# Patient Record
Sex: Male | Born: 1960 | Race: White | Hispanic: No | Marital: Married | Smoking: Never smoker
Health system: Southern US, Community
[De-identification: ages and names within clinical notes are randomized; demographics above are authoritative.]

## PROBLEM LIST (undated history)

## (undated) DIAGNOSIS — K219 Gastro-esophageal reflux disease without esophagitis: Secondary | ICD-10-CM

## (undated) DIAGNOSIS — G4730 Sleep apnea, unspecified: Secondary | ICD-10-CM

## (undated) DIAGNOSIS — D649 Anemia, unspecified: Secondary | ICD-10-CM

## (undated) DIAGNOSIS — I10 Essential (primary) hypertension: Secondary | ICD-10-CM

## (undated) DIAGNOSIS — R001 Bradycardia, unspecified: Secondary | ICD-10-CM

## (undated) HISTORY — DX: Essential (primary) hypertension: I10

## (undated) HISTORY — DX: Gastro-esophageal reflux disease without esophagitis: K21.9

## (undated) HISTORY — DX: Bradycardia, unspecified: R00.1

## (undated) HISTORY — PX: WISDOM TOOTH EXTRACTION: SHX21

## (undated) HISTORY — DX: Sleep apnea, unspecified: G47.30

## (undated) HISTORY — PX: HERNIA REPAIR: SHX51

## (undated) HISTORY — PX: INGUINAL HERNIA REPAIR: SUR1180

## (undated) HISTORY — DX: Anemia, unspecified: D64.9

---

## 2014-06-02 ENCOUNTER — Encounter: Payer: Self-pay | Admitting: Family Medicine

## 2014-06-02 ENCOUNTER — Ambulatory Visit (INDEPENDENT_AMBULATORY_CARE_PROVIDER_SITE_OTHER): Payer: BLUE CROSS/BLUE SHIELD | Admitting: Family Medicine

## 2014-06-02 ENCOUNTER — Telehealth: Payer: Self-pay | Admitting: *Deleted

## 2014-06-02 VITALS — BP 132/90 | HR 60 | Temp 98.3°F | Resp 16 | Ht 70.25 in | Wt 208.0 lb

## 2014-06-02 DIAGNOSIS — Z125 Encounter for screening for malignant neoplasm of prostate: Secondary | ICD-10-CM

## 2014-06-02 DIAGNOSIS — Z7689 Persons encountering health services in other specified circumstances: Secondary | ICD-10-CM

## 2014-06-02 DIAGNOSIS — Z130 Encounter for screening for diseases of the blood and blood-forming organs and certain disorders involving the immune mechanism: Secondary | ICD-10-CM | POA: Diagnosis not present

## 2014-06-02 DIAGNOSIS — Z13220 Encounter for screening for lipoid disorders: Secondary | ICD-10-CM

## 2014-06-02 DIAGNOSIS — Z7189 Other specified counseling: Secondary | ICD-10-CM | POA: Diagnosis not present

## 2014-06-02 DIAGNOSIS — Z131 Encounter for screening for diabetes mellitus: Secondary | ICD-10-CM

## 2014-06-02 LAB — LIPID PANEL
CHOLESTEROL: 148 mg/dL (ref 0–200)
HDL: 52 mg/dL (ref >=40)
LDL Cholesterol: 80 mg/dL (ref 0–99)
TRIGLYCERIDES: 78 mg/dL (ref <150)
Total CHOL/HDL Ratio: 2.8 Ratio
VLDL: 16 mg/dL (ref 0–40)

## 2014-06-02 LAB — COMPREHENSIVE METABOLIC PANEL
ALT: 21 U/L (ref 0–53)
AST: 16 U/L (ref 0–37)
Albumin: 4.7 g/dL (ref 3.5–5.2)
Alkaline Phosphatase: 56 U/L (ref 39–117)
BUN: 14 mg/dL (ref 6–23)
CALCIUM: 9.2 mg/dL (ref 8.4–10.5)
CHLORIDE: 103 meq/L (ref 96–112)
CO2: 28 meq/L (ref 19–32)
Creat: 0.84 mg/dL (ref 0.50–1.35)
Glucose, Bld: 100 mg/dL — ABNORMAL HIGH (ref 70–99)
Potassium: 4.1 mEq/L (ref 3.5–5.3)
Sodium: 139 mEq/L (ref 135–145)
Total Bilirubin: 0.5 mg/dL (ref 0.2–1.2)
Total Protein: 7 g/dL (ref 6.0–8.3)

## 2014-06-02 LAB — CBC
HEMATOCRIT: 42.4 % (ref 39.0–52.0)
Hemoglobin: 14.2 g/dL (ref 13.0–17.0)
MCH: 29.6 pg (ref 26.0–34.0)
MCHC: 33.5 g/dL (ref 30.0–36.0)
MCV: 88.5 fL (ref 78.0–100.0)
MPV: 10.8 fL (ref 8.6–12.4)
PLATELETS: 240 10*3/uL (ref 150–400)
RBC: 4.79 MIL/uL (ref 4.22–5.81)
RDW: 13.2 % (ref 11.5–15.5)
WBC: 6.2 10*3/uL (ref 4.0–10.5)

## 2014-06-02 NOTE — Patient Instructions (Signed)
Great to meet you today- I will be in touch with your labs via mail unless you decide to sign up for mychart.   We will work on getting your old records so I can update your immunizations, etc

## 2014-06-02 NOTE — Telephone Encounter (Signed)
Faxed signed authorization for medical record release to attention Vaughan Basta (MR). Patient's PCP in [facility] was Dr Nicoletta Dress. Confirmation page received at 2:33 pm

## 2014-06-02 NOTE — Progress Notes (Signed)
[facility] [address], [place] [address] [phone]  Date:  06/02/2014   Name:  Patrick Willis   DOB:  08/29/1960   MRN:  [id number]  PCP:  No primary care provider on file.    Chief Complaint: Establish Care   History of Present Illness:  Patrick Willis is a 54 y.o. very pleasant male patient who presents with the following:  Here today to establish care with me today. He and his wife and 2 children recently moved to [place] from [place], [place].  He is a generally health person.   His BP is generally ok. He given blood every 6 weeks and is always ok per [facility] He thinks that he had a tetanus shot in the last few years.  Declines a flu shot today He had a colonoscopy at 46 and 51- he was given a 10 year pass most recenlty.   He is not fasting today- he ate breakfast at 6:30 this am and it is about 12:30 now hoever  There are no active problems to display for this patient.   History reviewed. No pertinent past medical history.  Past Surgical History  Procedure Laterality Date  . Hernia repair      History  Substance Use Topics  . Smoking status: Never Smoker   . Smokeless tobacco: Not on file  . Alcohol Use: 0.0 oz/week    0 Standard drinks or equivalent per week    Family History  Problem Relation Age of Onset  . Cancer Mother   . Cancer Father   . Hypertension Father   . Cancer Sister   . Cancer Maternal Grandmother   . Cancer Maternal Grandfather   . Stroke Paternal Grandfather   . Diabetes Paternal Grandfather     No Known Allergies  Medication list has been reviewed and updated.  No current outpatient prescriptions on file prior to visit.   No current facility-administered medications on file prior to visit.    Review of Systems:  As per HPI- otherwise negative.   Physical Examination: Filed Vitals:   06/02/14 1203  BP: 142/90  Pulse: 51  Temp: 98.3 F (36.8 C)  Resp: 16   Filed Vitals:   06/02/14 1203   Height: 5' 10.25" (1.784 m)  Weight: 208 lb (94.348 kg)   Body mass index is 29.64 kg/(m^2). Ideal Body Weight: Weight in (lb) to have BMI = 25: 175.1  GEN: WDWN, NAD, Non-toxic, A & O x 3, looks well HEENT: Atraumatic, Normocephalic. Neck supple. No masses, No LAD. Ears and Nose: No external deformity. CV: RRR, No M/G/R. No JVD. No thrill. No extra heart sounds. PULM: CTA B, no wheezes, crackles, rhonchi. No retractions. No resp. distress. No accessory muscle use. ABD: S, NT, ND, +BS. No rebound. No HSM. EXTR: No c/c/e NEURO Normal gait.  PSYCH: Normally interactive. Conversant. Not depressed or anxious appearing.  Calm demeanor.    Assessment and Plan: Screening for hyperlipidemia - Plan: Lipid panel  Screening for diabetes mellitus - Plan: Comprehensive metabolic panel  Screening for deficiency anemia - Plan: CBC  Screening for prostate cancer - Plan: PSA  Establishing care with new doctor, encounter for   Labs done today.  He otherwise has no concerns, here to establish care.  Will see him for a CPE in a couple of weeks   Signed Lamar Blinks, MD

## 2014-06-03 ENCOUNTER — Encounter: Payer: Self-pay | Admitting: Family Medicine

## 2014-06-03 LAB — PSA: PSA: 0.8 ng/mL (ref <=4.00)

## 2014-06-16 ENCOUNTER — Encounter: Payer: Self-pay | Admitting: Family Medicine

## 2014-06-16 ENCOUNTER — Ambulatory Visit (INDEPENDENT_AMBULATORY_CARE_PROVIDER_SITE_OTHER): Payer: BLUE CROSS/BLUE SHIELD | Admitting: Family Medicine

## 2014-06-16 VITALS — BP 126/84 | HR 57 | Temp 98.0°F | Resp 16 | Ht 70.25 in | Wt 208.4 lb

## 2014-06-16 DIAGNOSIS — R1030 Lower abdominal pain, unspecified: Secondary | ICD-10-CM

## 2014-06-16 DIAGNOSIS — Z0000 Encounter for general adult medical examination without abnormal findings: Secondary | ICD-10-CM | POA: Diagnosis not present

## 2014-06-16 DIAGNOSIS — R1032 Left lower quadrant pain: Secondary | ICD-10-CM

## 2014-06-16 NOTE — Progress Notes (Signed)
[facility] [address], [place] [address] [phone]  Date:  06/16/2014   Name:  Patrick Willis   DOB:  [birth year]   MRN:  [id number]  PCP:  Lamar Blinks, MD    Chief Complaint: Follow-up and Groin Pain   History of Present Illness:  Patrick Willis is a 54 y.o. very pleasant male patient who presents with the following:  Here today for a CPE.  We met and did labs a couple of weeks ago- his labs looked fine.   colonoscopy is UTD, tdap is UTD.  He notes a pain in his left groin- recently he was at a party, had been on his feet a lot. He notes some discomfort while standing; this occurred a week or so ago.   He did have a ventral hernia repair; this was done twice, 2004 with mesh, and redone 2008 and mesh remobed.  He has not noted a groin bulge  There are no active problems to display for this patient.   History reviewed. No pertinent past medical history.  Past Surgical History  Procedure Laterality Date  . Hernia repair      History  Substance Use Topics  . Smoking status: Never Smoker   . Smokeless tobacco: Not on file  . Alcohol Use: 0.0 oz/week    0 Standard drinks or equivalent per week    Family History  Problem Relation Age of Onset  . Cancer Mother   . Cancer Father   . Hypertension Father   . Cancer Sister   . Cancer Maternal Grandmother   . Cancer Maternal Grandfather   . Stroke Paternal Grandfather   . Diabetes Paternal Grandfather     No Known Allergies  Medication list has been reviewed and updated.  Current Outpatient Prescriptions on File Prior to Visit  Medication Sig Dispense Refill  . Multiple Vitamins-Minerals (MULTIVITAMIN WITH MINERALS) tablet Take 1 tablet by mouth daily.     No current facility-administered medications on file prior to visit.    Review of Systems:  As per HPI- otherwise negative. He has twin girls and a son.    Physical Examination: Filed Vitals:   06/16/14 1146  BP: 126/84   Pulse: 57  Temp: 98 F (36.7 C)  Resp: 16   Filed Vitals:   06/16/14 1146  Height: 5' 10.25" (1.784 m)  Weight: 208 lb 6.4 oz (94.53 kg)   Body mass index is 29.7 kg/(m^2). Ideal Body Weight: Weight in (lb) to have BMI = 25: 175.1  GEN: WDWN, NAD, Non-toxic, A & O x 3, looks well, overweight HEENT: Atraumatic, Normocephalic. Neck supple. No masses, No LAD.  Bilateral TM wnl, oropharynx normal.  PEERL,EOMI.   Ears and Nose: No external deformity. CV: RRR, No M/G/R. No JVD. No thrill. No extra heart sounds. PULM: CTA B, no wheezes, crackles, rhonchi. No retractions. No resp. distress. No accessory muscle use. ABD: S, NT, ND, +BS. No rebound. No HSM. EXTR: No c/c/e NEURO Normal gait.  PSYCH: Normally interactive. Conversant. Not depressed or anxious appearing.  Calm demeanor.  GU: normal testicles, scrotum and penis. No palpable inguinal hernia with valsalva.  No tenderness along inguinal canal at his time  Declined DRE as he has a recent normal PSA and also recent colonoscopy  Assessment and Plan: Physical exam  Left groin pain  Physical exam- encouraged weight control, exercise.  He had labs drawn at his last visit and they looked fine.  Possible early inguinal hernia- for  now I doubt that any hernia would be found on exam by a surgeon either.  Advised him to watch this closely and let me know if he has any worsening or other concerns.  Also discussed si/sx of strangulation or incarceration.    Signed Lamar Blinks, MD

## 2014-06-16 NOTE — Patient Instructions (Signed)
Your labs all look good Let me know if you have any further concerns about your hernia.   If you have any severe or persistent pain please seek care right away Otherwise let's plan to recheck in one year

## 2015-04-17 ENCOUNTER — Encounter: Payer: Self-pay | Admitting: Family Medicine

## 2015-04-22 ENCOUNTER — Encounter: Payer: Self-pay | Admitting: Family Medicine

## 2015-06-22 ENCOUNTER — Ambulatory Visit: Payer: BLUE CROSS/BLUE SHIELD | Admitting: Family Medicine

## 2015-09-02 ENCOUNTER — Encounter: Payer: BLUE CROSS/BLUE SHIELD | Admitting: Family Medicine

## 2015-09-03 ENCOUNTER — Encounter: Payer: Self-pay | Admitting: Family Medicine

## 2015-09-03 ENCOUNTER — Ambulatory Visit (INDEPENDENT_AMBULATORY_CARE_PROVIDER_SITE_OTHER): Payer: BLUE CROSS/BLUE SHIELD | Admitting: Family Medicine

## 2015-09-03 VITALS — BP 114/92 | HR 50 | Temp 98.7°F | Resp 16 | Ht 70.5 in | Wt 197.4 lb

## 2015-09-03 DIAGNOSIS — Z130 Encounter for screening for diseases of the blood and blood-forming organs and certain disorders involving the immune mechanism: Secondary | ICD-10-CM

## 2015-09-03 DIAGNOSIS — Z0000 Encounter for general adult medical examination without abnormal findings: Secondary | ICD-10-CM | POA: Diagnosis not present

## 2015-09-03 DIAGNOSIS — Z1159 Encounter for screening for other viral diseases: Secondary | ICD-10-CM | POA: Diagnosis not present

## 2015-09-03 DIAGNOSIS — K4020 Bilateral inguinal hernia, without obstruction or gangrene, not specified as recurrent: Secondary | ICD-10-CM

## 2015-09-03 DIAGNOSIS — Z125 Encounter for screening for malignant neoplasm of prostate: Secondary | ICD-10-CM | POA: Diagnosis not present

## 2015-09-03 DIAGNOSIS — Z13220 Encounter for screening for lipoid disorders: Secondary | ICD-10-CM

## 2015-09-03 DIAGNOSIS — Z114 Encounter for screening for human immunodeficiency virus [HIV]: Secondary | ICD-10-CM | POA: Diagnosis not present

## 2015-09-03 DIAGNOSIS — Z131 Encounter for screening for diabetes mellitus: Secondary | ICD-10-CM

## 2015-09-03 LAB — COMPLETE METABOLIC PANEL WITH GFR
ALT: 13 U/L (ref 9–46)
AST: 13 U/L (ref 10–35)
Albumin: 4.5 g/dL (ref 3.6–5.1)
Alkaline Phosphatase: 46 U/L (ref 40–115)
BILIRUBIN TOTAL: 0.6 mg/dL (ref 0.2–1.2)
BUN: 10 mg/dL (ref 7–25)
CO2: 26 mmol/L (ref 20–31)
Calcium: 9.4 mg/dL (ref 8.6–10.3)
Chloride: 104 mmol/L (ref 98–110)
Creat: 0.78 mg/dL (ref 0.70–1.33)
GFR, Est African American: >89 mL/min (ref >=60)
GLUCOSE: 105 mg/dL — AB (ref 65–99)
Potassium: 4.2 mmol/L (ref 3.5–5.3)
SODIUM: 139 mmol/L (ref 135–146)
TOTAL PROTEIN: 7.1 g/dL (ref 6.1–8.1)

## 2015-09-03 LAB — CBC WITH DIFFERENTIAL/PLATELET
Basophils Absolute: 0 cells/uL (ref 0–200)
Basophils Relative: 0 %
EOS PCT: 1 %
Eosinophils Absolute: 69 cells/uL (ref 15–500)
HCT: 41.3 % (ref 38.5–50.0)
HEMOGLOBIN: 13.8 g/dL (ref 13.2–17.1)
LYMPHS ABS: 1518 {cells}/uL (ref 850–3900)
Lymphocytes Relative: 22 %
MCH: 28.6 pg (ref 27.0–33.0)
MCHC: 33.4 g/dL (ref 32.0–36.0)
MCV: 85.5 fL (ref 80.0–100.0)
MONO ABS: 483 {cells}/uL (ref 200–950)
MPV: 10.4 fL (ref 7.5–12.5)
Monocytes Relative: 7 %
NEUTROS PCT: 70 %
Neutro Abs: 4830 cells/uL (ref 1500–7800)
PLATELETS: 279 10*3/uL (ref 140–400)
RBC: 4.83 MIL/uL (ref 4.20–5.80)
RDW: 14.1 % (ref 11.0–15.0)
WBC: 6.9 10*3/uL (ref 3.8–10.8)

## 2015-09-03 LAB — LIPID PANEL
CHOLESTEROL: 185 mg/dL (ref 125–200)
HDL: 76 mg/dL (ref >=40)
LDL Cholesterol: 96 mg/dL (ref <130)
Total CHOL/HDL Ratio: 2.4 Ratio (ref <=5.0)
Triglycerides: 63 mg/dL (ref <150)
VLDL: 13 mg/dL (ref <30)

## 2015-09-03 LAB — HEPATITIS C ANTIBODY: HCV AB: NEGATIVE

## 2015-09-03 LAB — HIV ANTIBODY (ROUTINE TESTING W REFLEX): HIV: NONREACTIVE

## 2015-09-03 NOTE — Progress Notes (Signed)
Subjective:  By signing my name below, I, [name], attest that this documentation has been prepared under the direction and in the presence of Merri Ray, MD. Electronically Signed: [name], [place]. 09/03/2015 , 10:34 AM .  Patient was seen in Room 27.    Patient ID: Patrick Willis, male    DOB: 1960/05/26, 55 y.o.   MRN: [id number] Chief Complaint  Patient presents with  . Annual Exam   HPI Patrick Willis is a 55 y.o. male Here for annual physical. Previous patient of Dr. Lorelei Pont with last physical in March 2016. Noted to have possible early inguinal hernia in left groin but not noted on exam and RTC precautions discussed. He had black coffee this morning.   He used to live in [place], [place]. He moved to [place] due to work.   Blood Pressure Borderline diastolic today, most recent visits in [id number] diastolic was 90 and 84. He reports his BP usually measuring at 120/80.   Cancer Screening Colonoscopy: Oct 2013, recheck in 10 years.  Prostate:  Lab Results  Component Value Date   PSA 0.80 06/02/2014   Family History Maternal grandmother: colon cancer in her 26s Maternal grandfather: lung cancer, was a smoker Mother: breast cancer Father: leukemia  Immunizations  There is no immunization history on file for this patient.  He had a tdap in August 2013.   Depression Depression screen [facility] 2/9 09/03/2015 06/16/2014  Decreased Interest 0 0  Down, Depressed, Hopeless 0 0  PHQ - 2 Score 0 0    Vision  Visual Acuity Screening   Right eye Left eye Both eyes  Without correction:     With correction: 20/20 20/20 20/20    He's seen his eye doctor within past year.   Dentist He notes seeing his dentist every 6 months.   Exercise He is running 2 miles 4 times a week. He reports mostly walking and mowing his lawn. He denies any chest pain when exercising.   HIV Screening / HepC Screening He doesn't recall having the screening done.    There are no active  problems to display for this patient.  No past medical history on file. Past Surgical History  Procedure Laterality Date  . Hernia repair     No Known Allergies Prior to Admission medications   Medication Sig Start Date End Date Taking? Authorizing Provider  Multiple Vitamins-Minerals (MULTIVITAMIN WITH MINERALS) tablet Take 1 tablet by mouth daily.    Historical Provider, MD   Social History   Social History  . Marital Status: Married    Spouse Name: N/A  . Number of Children: N/A  . Years of Education: N/A   Occupational History  . Not on file.   Social History Main Topics  . Smoking status: Never Smoker   . Smokeless tobacco: Not on file  . Alcohol Use: 0.0 oz/week    0 Standard drinks or equivalent per week  . Drug Use: No  . Sexual Activity: Not on file   Other Topics Concern  . Not on file   Social History Narrative    Review of Systems 13 point ROS - no positive responses     Objective:   Physical Exam  Constitutional: He is oriented to person, place, and time. He appears well-developed and well-nourished.  HENT:  Head: Normocephalic and atraumatic.  Right Ear: External ear normal.  Left Ear: External ear normal.  Mouth/Throat: Oropharynx is clear and moist.  Eyes: Conjunctivae and EOM are normal. Pupils  are equal, round, and reactive to light.  Neck: Normal range of motion. Neck supple. No thyromegaly present.  Cardiovascular: Normal rate, regular rhythm, normal heart sounds and intact distal pulses.   Pulmonary/Chest: Effort normal and breath sounds normal. No respiratory distress. He has no wheezes.  Abdominal: Soft. He exhibits no distension. There is no tenderness. Hernia confirmed negative in the right inguinal area and confirmed negative in the left inguinal area.  Genitourinary: Prostate normal.  bilateral easily reduceable inguinal hernias with palpation of right hernia into canal with coughing, non tender  Musculoskeletal: Normal range of  motion. He exhibits no edema or tenderness.  Lymphadenopathy:    He has no cervical adenopathy.  Neurological: He is alert and oriented to person, place, and time. He has normal reflexes.  Skin: Skin is warm and dry.  Psychiatric: He has a normal mood and affect. His behavior is normal.  Vitals reviewed.    Filed Vitals:   09/03/15 0958  BP: 114/92  Pulse: 50  Temp: 98.7 F (37.1 C)  TempSrc: Oral  Resp: 16  Height: 5' 10.5" (1.791 m)  Weight: 197 lb 6.4 oz (89.54 kg)  SpO2: 98%      Assessment & Plan:   Patrick Willis is a 55 y.o. male Annual physical exam  --anticipatory guidance as below in AVS, screening labs above. Health maintenance items as above in HPI discussed/recommended as applicable.   Screening for prostate cancer - Plan: PSA  -We discussed pros and cons of prostate cancer screening, and after this discussion, he chose to have screening done. PSA obtained, and no concerning findings on DRE.   Screening for hyperlipidemia - Plan: Lipid panel  Screening for diabetes mellitus - Plan: COMPLETE METABOLIC PANEL WITH GFR  Screening, anemia, deficiency, iron - Plan: CBC with Differential/Platelet  -FH of leukemia. Check cbc.   Screening for HIV (human immunodeficiency virus) - Plan: HIV antibody  Need for hepatitis C screening test - Plan: Hepatitis C antibody  Bilateral inguinal hernia without obstruction or gangrene, recurrence not specified - Plan: Ambulatory referral to General Surgery  -Appears to have bilateral hernias, minimal symptoms, no concerning findings on exam.. Refer to general surgery for evaluation and discussion of options. RTC/ER precautions.   No orders of the defined types were placed in this encounter.   Patient Instructions       IF you received an x-ray today, you will receive an invoice from [facility]. Please contact [facility] at [phone] with questions or concerns regarding your invoice.   IF you received  labwork today, you will receive an invoice from [employer]. Please contact [facility] at [phone] with questions or concerns regarding your invoice.   Our billing staff will not be able to assist you with questions regarding bills from these companies.  You will be contacted with the lab results as soon as they are available. The fastest way to get your results is to activate your My Chart account. Instructions are located on the last page of this paperwork. If you have not heard from [place] regarding the results in 2 weeks, please contact this office.    I do suspect hernias on both sides today, but they are easily reducible. I will refer you to the surgeon to discuss these areas further, but see precautions below.   Keep a record of your blood pressures outside of the office and  If lower number is over 90 persistently - return to discuss further.   Keeping you  healthy  Get these tests  Blood pressure- Have your blood pressure checked once a year by your healthcare provider.  Normal blood pressure is 120/80  Weight- Have your body mass index (BMI) calculated to screen for obesity.  BMI is a measure of body fat based on height and weight. You can also calculate your own BMI at ViewBanking.si.  Cholesterol- Have your cholesterol checked every year.  Diabetes- Have your blood sugar checked regularly if you have high blood pressure, high cholesterol, have a family history of diabetes or if you are overweight.  Screening for Colon Cancer- Colonoscopy starting at age 66.  Screening may begin sooner depending on your family history and other health conditions. Follow up colonoscopy as directed by your Gastroenterologist.  Screening for Prostate Cancer- Both blood work (PSA) and a rectal exam help screen for Prostate Cancer.  Screening begins at age 56 with African-American men and at age 81 with Caucasian men.  Screening may begin sooner depending on your family  history.  Take these medicines  Aspirin- One aspirin daily can help prevent Heart disease and Stroke.  Flu shot- Every fall.  Tetanus- Every 10 years.  Zostavax- Once after the age of 37 to prevent Shingles.  Pneumonia shot- Once after the age of 23; if you are younger than 49, ask your healthcare provider if you need a Pneumonia shot.  Take these steps  Don't smoke- If you do smoke, talk to your doctor about quitting.  For tips on how to quit, go to www.smokefree.gov or call 1-800-QUIT-NOW.  Be physically active- Exercise 5 days a week for at least 30 minutes.  If you are not already physically active start slow and gradually work up to 30 minutes of moderate physical activity.  Examples of moderate activity include walking briskly, mowing the yard, dancing, swimming, bicycling, etc.  Eat a healthy diet- Eat a variety of healthy food such as fruits, vegetables, low fat milk, low fat cheese, yogurt, lean meant, poultry, fish, beans, tofu, etc. For more information go to www.thenutritionsource.org  Drink alcohol in moderation- Limit alcohol intake to less than two drinks a day. Never drink and drive.  Dentist- Brush and floss twice daily; visit your dentist twice a year.  Depression- Your emotional health is as important as your physical health. If you're feeling down, or losing interest in things you would normally enjoy please talk to your healthcare provider.  Eye exam- Visit your eye doctor every year.  Safe sex- If you may be exposed to a sexually transmitted infection, use a condom.  Seat belts- Seat belts can save your life; always wear one.  Smoke/Carbon Monoxide detectors- These detectors need to be installed on the appropriate level of your home.  Replace batteries at least once a year.  Skin cancer- When out in the sun, cover up and use sunscreen 15 SPF or higher.  Violence- If anyone is threatening you, please tell your healthcare provider.  Living Will/ Health care  power of attorney- Speak with your healthcare provider and family.  Hernia, Adult A hernia is the bulging of an organ or tissue through a weak spot in the muscles of the abdomen (abdominal wall). Hernias develop most often near the navel or groin. There are many kinds of hernias. Common kinds include:  Femoral hernia. This kind of hernia develops under the groin in the upper thigh area.  Inguinal hernia. This kind of hernia develops in the groin or scrotum.  Umbilical hernia. This kind of hernia develops  near the navel.  Hiatal hernia. This kind of hernia causes part of the stomach to be pushed up into the chest.  Incisional hernia. This kind of hernia bulges through a scar from an abdominal surgery. CAUSES This condition may be caused by:  Heavy lifting.  Coughing over a long period of time.  Straining to have a bowel movement.  An incision made during an abdominal surgery.  A birth defect (congenital defect).  Excess weight or obesity.  Smoking.  Poor nutrition.  Cystic fibrosis.  Excess fluid in the abdomen.  Undescended testicles. SYMPTOMS Symptoms of a hernia include:  A lump on the abdomen. This is the first sign of a hernia. The lump may become more obvious with standing, straining, or coughing. It may get bigger over time if it is not treated or if the condition causing it is not treated.  Pain. A hernia is usually painless, but it may become painful over time if treatment is delayed. The pain is usually dull and may get worse with standing or lifting heavy objects. Sometimes a hernia gets tightly squeezed in the weak spot (strangulated) or stuck there (incarcerated) and causes additional symptoms. These symptoms may include:  Vomiting.  Nausea.  Constipation.  Irritability. DIAGNOSIS A hernia may be diagnosed with:  A physical exam. During the exam your health care provider may ask you to cough or to make a specific movement, because a hernia is  usually more visible when you move.  Imaging tests. These can include:  X-rays.  Ultrasound.  CT scan. TREATMENT A hernia that is small and painless may not need to be treated. A hernia that is large or painful may be treated with surgery. Inguinal hernias may be treated with surgery to prevent incarceration or strangulation. Strangulated hernias are always treated with surgery, because lack of blood to the trapped organ or tissue can cause it to die. Surgery to treat a hernia involves pushing the bulge back into place and repairing the weak part of the abdomen. HOME CARE INSTRUCTIONS  Avoid straining.  Do not lift anything heavier than 10 lb (4.5 kg).  Lift with your leg muscles, not your back muscles. This helps avoid strain.  When coughing, try to cough gently.  Prevent constipation. Constipation leads to straining with bowel movements, which can make a hernia worse or cause a hernia repair to break down. You can prevent constipation by:  Eating a high-fiber diet that includes plenty of fruits and vegetables.  Drinking enough fluids to keep your urine clear or pale yellow. Aim to drink 6-8 glasses of water per day.  Using a stool softener as directed by your health care provider.  Lose weight, if you are overweight.  Do not use any tobacco products, including cigarettes, chewing tobacco, or electronic cigarettes. If you need help quitting, ask your health care provider.  Keep all follow-up visits as directed by your health care provider. This is important. Your health care provider may need to monitor your condition. SEEK MEDICAL CARE IF:  You have swelling, redness, and pain in the affected area.  Your bowel habits change. SEEK IMMEDIATE MEDICAL CARE IF:  You have a fever.  You have abdominal pain that is getting worse.  You feel nauseous or you vomit.  You cannot push the hernia back in place by gently pressing on it while you are lying down.  The  hernia:  Changes in shape or size.  Is stuck outside the abdomen.  Becomes discolored.  Feels  hard or tender.   This information is not intended to replace advice given to you by your health care provider. Make sure you discuss any questions you have with your health care provider.   Document Released: 03/14/2005 Document Revised: 04/04/2014 Document Reviewed: 01/22/2014 Elsevier Interactive Patient Education [employer].     I personally performed the services described in this documentation, which was scribed in my presence. The recorded information has been reviewed and considered, and addended by me as needed.   Signed,   Merri Ray, MD [facility].  09/05/2015 10:05 PM

## 2015-09-03 NOTE — Patient Instructions (Addendum)
IF you received an x-ray today, you will receive an invoice from [facility]. Please contact [facility] at [phone] with questions or concerns regarding your invoice.   IF you received labwork today, you will receive an invoice from [employer]. Please contact [facility] at [phone] with questions or concerns regarding your invoice.   Our billing staff will not be able to assist you with questions regarding bills from these companies.  You will be contacted with the lab results as soon as they are available. The fastest way to get your results is to activate your My Chart account. Instructions are located on the last page of this paperwork. If you have not heard from [place] regarding the results in 2 weeks, please contact this office.    I do suspect hernias on both sides today, but they are easily reducible. I will refer you to the surgeon to discuss these areas further, but see precautions below.   Keep a record of your blood pressures outside of the office and  If lower number is over 90 persistently - return to discuss further.   Keeping you healthy  Get these tests  Blood pressure- Have your blood pressure checked once a year by your healthcare provider.  Normal blood pressure is 120/80  Weight- Have your body mass index (BMI) calculated to screen for obesity.  BMI is a measure of body fat based on height and weight. You can also calculate your own BMI at [email or web address].  Cholesterol- Have your cholesterol checked every year.  Diabetes- Have your blood sugar checked regularly if you have high blood pressure, high cholesterol, have a family history of diabetes or if you are overweight.  Screening for Colon Cancer- Colonoscopy starting at age 47.  Screening may begin sooner depending on your family history and other health conditions. Follow up colonoscopy as directed by your Gastroenterologist.  Screening for Prostate  Cancer- Both blood work (PSA) and a rectal exam help screen for Prostate Cancer.  Screening begins at age 74 with African-American men and at age 51 with Caucasian men.  Screening may begin sooner depending on your family history.  Take these medicines  Aspirin- One aspirin daily can help prevent Heart disease and Stroke.  Flu shot- Every fall.  Tetanus- Every 10 years.  Zostavax- Once after the age of 74 to prevent Shingles.  Pneumonia shot- Once after the age of 33; if you are younger than 15, ask your healthcare provider if you need a Pneumonia shot.  Take these steps  Don't smoke- If you do smoke, talk to your doctor about quitting.  For tips on how to quit, go to www.smokefree.gov or call 1-800-QUIT-NOW.  Be physically active- Exercise 5 days a week for at least 30 minutes.  If you are not already physically active start slow and gradually work up to 30 minutes of moderate physical activity.  Examples of moderate activity include walking briskly, mowing the yard, dancing, swimming, bicycling, etc.  Eat a healthy diet- Eat a variety of healthy food such as fruits, vegetables, low fat milk, low fat cheese, yogurt, lean meant, poultry, fish, beans, tofu, etc. For more information go to www.thenutritionsource.org  Drink alcohol in moderation- Limit alcohol intake to less than two drinks a day. Never drink and drive.  Dentist- Brush and floss twice daily; visit your dentist twice a year.  Depression- Your emotional health is as important as your physical health. If you're feeling down, or losing interest in things  you would normally enjoy please talk to your healthcare provider.  Eye exam- Visit your eye doctor every year.  Safe sex- If you may be exposed to a sexually transmitted infection, use a condom.  Seat belts- Seat belts can save your life; always wear one.  Smoke/Carbon Monoxide detectors- These detectors need to be installed on the appropriate level of your home.  Replace  batteries at least once a year.  Skin cancer- When out in the sun, cover up and use sunscreen 15 SPF or higher.  Violence- If anyone is threatening you, please tell your healthcare provider.  Living Will/ Health care power of attorney- Speak with your healthcare provider and family.  Hernia, Adult A hernia is the bulging of an organ or tissue through a weak spot in the muscles of the abdomen (abdominal wall). Hernias develop most often near the navel or groin. There are many kinds of hernias. Common kinds include:  Femoral hernia. This kind of hernia develops under the groin in the upper thigh area.  Inguinal hernia. This kind of hernia develops in the groin or scrotum.  Umbilical hernia. This kind of hernia develops near the navel.  Hiatal hernia. This kind of hernia causes part of the stomach to be pushed up into the chest.  Incisional hernia. This kind of hernia bulges through a scar from an abdominal surgery. CAUSES This condition may be caused by:  Heavy lifting.  Coughing over a long period of time.  Straining to have a bowel movement.  An incision made during an abdominal surgery.  A birth defect (congenital defect).  Excess weight or obesity.  Smoking.  Poor nutrition.  Cystic fibrosis.  Excess fluid in the abdomen.  Undescended testicles. SYMPTOMS Symptoms of a hernia include:  A lump on the abdomen. This is the first sign of a hernia. The lump may become more obvious with standing, straining, or coughing. It may get bigger over time if it is not treated or if the condition causing it is not treated.  Pain. A hernia is usually painless, but it may become painful over time if treatment is delayed. The pain is usually dull and may get worse with standing or lifting heavy objects. Sometimes a hernia gets tightly squeezed in the weak spot (strangulated) or stuck there (incarcerated) and causes additional symptoms. These symptoms may  include:  Vomiting.  Nausea.  Constipation.  Irritability. DIAGNOSIS A hernia may be diagnosed with:  A physical exam. During the exam your health care provider may ask you to cough or to make a specific movement, because a hernia is usually more visible when you move.  Imaging tests. These can include:  X-rays.  Ultrasound.  CT scan. TREATMENT A hernia that is small and painless may not need to be treated. A hernia that is large or painful may be treated with surgery. Inguinal hernias may be treated with surgery to prevent incarceration or strangulation. Strangulated hernias are always treated with surgery, because lack of blood to the trapped organ or tissue can cause it to die. Surgery to treat a hernia involves pushing the bulge back into place and repairing the weak part of the abdomen. HOME CARE INSTRUCTIONS  Avoid straining.  Do not lift anything heavier than 10 lb (4.5 kg).  Lift with your leg muscles, not your back muscles. This helps avoid strain.  When coughing, try to cough gently.  Prevent constipation. Constipation leads to straining with bowel movements, which can make a hernia worse or cause a  hernia repair to break down. You can prevent constipation by:  Eating a high-fiber diet that includes plenty of fruits and vegetables.  Drinking enough fluids to keep your urine clear or pale yellow. Aim to drink 6-8 glasses of water per day.  Using a stool softener as directed by your health care provider.  Lose weight, if you are overweight.  Do not use any tobacco products, including cigarettes, chewing tobacco, or electronic cigarettes. If you need help quitting, ask your health care provider.  Keep all follow-up visits as directed by your health care provider. This is important. Your health care provider may need to monitor your condition. SEEK MEDICAL CARE IF:  You have swelling, redness, and pain in the affected area.  Your bowel habits change. SEEK  IMMEDIATE MEDICAL CARE IF:  You have a fever.  You have abdominal pain that is getting worse.  You feel nauseous or you vomit.  You cannot push the hernia back in place by gently pressing on it while you are lying down.  The hernia:  Changes in shape or size.  Is stuck outside the abdomen.  Becomes discolored.  Feels hard or tender.   This information is not intended to replace advice given to you by your health care provider. Make sure you discuss any questions you have with your health care provider.   Document Released: 03/14/2005 Document Revised: 04/04/2014 Document Reviewed: 01/22/2014 Elsevier Interactive Patient Education [employer].

## 2015-09-04 LAB — PSA: PSA: 0.91 ng/mL (ref <=4.00)

## 2016-10-06 ENCOUNTER — Ambulatory Visit (INDEPENDENT_AMBULATORY_CARE_PROVIDER_SITE_OTHER): Payer: BLUE CROSS/BLUE SHIELD | Admitting: Family Medicine

## 2016-10-06 ENCOUNTER — Encounter: Payer: Self-pay | Admitting: Family Medicine

## 2016-10-06 VITALS — BP 127/81 | HR 52 | Temp 97.6°F | Resp 16 | Ht 70.5 in | Wt 201.4 lb

## 2016-10-06 DIAGNOSIS — Z0000 Encounter for general adult medical examination without abnormal findings: Secondary | ICD-10-CM

## 2016-10-06 DIAGNOSIS — R0683 Snoring: Secondary | ICD-10-CM | POA: Diagnosis not present

## 2016-10-06 DIAGNOSIS — R739 Hyperglycemia, unspecified: Secondary | ICD-10-CM | POA: Diagnosis not present

## 2016-10-06 DIAGNOSIS — Z125 Encounter for screening for malignant neoplasm of prostate: Secondary | ICD-10-CM | POA: Diagnosis not present

## 2016-10-06 DIAGNOSIS — Z13220 Encounter for screening for lipoid disorders: Secondary | ICD-10-CM

## 2016-10-06 DIAGNOSIS — R400 Somnolence: Secondary | ICD-10-CM | POA: Diagnosis not present

## 2016-10-06 NOTE — Patient Instructions (Addendum)
I will refer you to the sleep specialist to decide on evaluation for snoring and possible sleep apnea testing. I will also check kidney, liver, cholesterol and prostate tests. I did send off a 3 month blood sugar average as well.  Let me know if you have any questions.  Keeping you healthy  Get these tests  Blood pressure- Have your blood pressure checked once a year by your healthcare provider.  Normal blood pressure is 120/80  Weight- Have your body mass index (BMI) calculated to screen for obesity.  BMI is a measure of body fat based on height and weight. You can also calculate your own BMI at [email or web address].  Cholesterol- Have your cholesterol checked every year.  Diabetes- Have your blood sugar checked regularly if you have high blood pressure, high cholesterol, have a family history of diabetes or if you are overweight.  Screening for Colon Cancer- Colonoscopy starting at age 40.  Screening may begin sooner depending on your family history and other health conditions. Follow up colonoscopy as directed by your Gastroenterologist.  Screening for Prostate Cancer- Both blood work (PSA) and a rectal exam help screen for Prostate Cancer.  Screening begins at age 43 with African-American men and at age 16 with Caucasian men.  Screening may begin sooner depending on your family history.  Take these medicines  Aspirin- One aspirin daily can help prevent Heart disease and Stroke.  Flu shot- Every fall.  Tetanus- Every 10 years.  Zostavax- Once after the age of 42 to prevent Shingles.  Pneumonia shot- Once after the age of 49; if you are younger than 74, ask your healthcare provider if you need a Pneumonia shot.  Take these steps  Don't smoke- If you do smoke, talk to your doctor about quitting.  For tips on how to quit, go to www.smokefree.gov or call 1-800-QUIT-NOW.  Be physically active- Exercise 5 days a week for at least 30 minutes.  If you are not already physically  active start slow and gradually work up to 30 minutes of moderate physical activity.  Examples of moderate activity include walking briskly, mowing the yard, dancing, swimming, bicycling, etc.  Eat a healthy diet- Eat a variety of healthy food such as fruits, vegetables, low fat milk, low fat cheese, yogurt, lean meant, poultry, fish, beans, tofu, etc. For more information go to www.thenutritionsource.org  Drink alcohol in moderation- Limit alcohol intake to less than two drinks a day. Never drink and drive.  Dentist- Brush and floss twice daily; visit your dentist twice a year.  Depression- Your emotional health is as important as your physical health. If you're feeling down, or losing interest in things you would normally enjoy please talk to your healthcare provider.  Eye exam- Visit your eye doctor every year.  Safe sex- If you may be exposed to a sexually transmitted infection, use a condom.  Seat belts- Seat belts can save your life; always wear one.  Smoke/Carbon Monoxide detectors- These detectors need to be installed on the appropriate level of your home.  Replace batteries at least once a year.  Skin cancer- When out in the sun, cover up and use sunscreen 15 SPF or higher.  Violence- If anyone is threatening you, please tell your healthcare provider.  Living Will/ Health care power of attorney- Speak with your healthcare provider and family.   IF you received an x-ray today, you will receive an invoice from [facility]. Please contact [facility] at [phone] with questions or concerns regarding your  invoice.   IF you received labwork today, you will receive an invoice from [place]. Please contact [facility] at [phone] with questions or concerns regarding your invoice.   Our billing staff will not be able to assist you with questions regarding bills from these companies.  You will be contacted with the lab results as soon as they are available. The  fastest way to get your results is to activate your My Chart account. Instructions are located on the last page of this paperwork. If you have not heard from [place] regarding the results in 2 weeks, please contact this office.

## 2016-10-06 NOTE — Progress Notes (Signed)
Subjective:  By signing my name below, I, [name], attest that this documentation has been prepared under the direction and in the presence of Merri Ray, MD. Electronically Signed: [name], [place]. 10/06/2016 , 4:05 PM .  Patient was seen in Room 26 .   Patient ID: Patrick Willis, male    DOB: October 19, 1960, 56 y.o.   MRN: [id number] Chief Complaint  Patient presents with  . Annual Exam    wife states his snoring has gotten worse   HPI Patrick Willis is a 56 y.o. male Here for annual physical. Last physical with me in June 2017.   Hernia repair He had bilateral inguinal hernia repair with mesh done last year, done at [facility] by Dr. Zella Richer.   Snoring His wife informs his snoring has been more intense. He feels fine in the morning, but starts dragging after lunch. He's been trying to eat lighter during the day, and would also snack on [employer] Protein bars that contain artificial sugars. He has been taking 10 minute power naps during the day recently. She denies feeling fatigue or sleepy behind the wheel. He denies having sleep study done in the past.   Cancer Screening Colonoscopy: Oct 2013, recheck in 10 years.  Skin cancer screening: evaluated by dermatologist a few months ago, and had a few biopsies that were negative; no skin cancer.  Prostate cancer screening:  Lab Results  Component Value Date   PSA 0.91 09/03/2015   PSA 0.80 06/02/2014   Hep C screening: done last year, negative HIV screening: non reactive last year  Immunizations  There is no immunization history on file for this patient.   Depression Depression screen [facility] 2/9 10/06/2016 09/03/2015 06/16/2014  Decreased Interest 0 0 0  Down, Depressed, Hopeless 0 0 0  PHQ - 2 Score 0 0 0     Vision  Visual Acuity Screening   Right eye Left eye Both eyes  Without correction:     With correction: 20/20 20/20 20/15    He is followed by eye doctor, last seen 6 months ago.    Hyperglycemia Borderline last year at 105.   Wt Readings from Last 3 Encounters:  10/06/16 201 lb 6.4 oz (91.4 kg)  09/03/15 197 lb 6.4 oz (89.5 kg)  06/16/14 208 lb 6.4 oz (94.5 kg)   Dentist He sees his dentist every 6 months.   Exercise He does brisk walking every morning for 40 minutes. He also push mows his yard once every couple of days. He hasn't been using free weights lately.   There are no active problems to display for this patient.  No past medical history on file. Past Surgical History:  Procedure Laterality Date  . HERNIA REPAIR     No Known Allergies Prior to Admission medications   Medication Sig Start Date End Date Taking? Authorizing Provider  Multiple Vitamins-Minerals (MULTIVITAMIN WITH MINERALS) tablet Take 1 tablet by mouth daily.    [provider]   Social History   Social History  . Marital status: Married    Spouse name: N/A  . Number of children: N/A  . Years of education: N/A   Occupational History  . SALES REP    Social History Main Topics  . Smoking status: Never Smoker  . Smokeless tobacco: Never Used  . Alcohol use 0.0 oz/week  . Drug use: No  . Sexual activity: Not on file   Other Topics Concern  . Not on file   Social History  Narrative   Exercise walking x 2 miles four times/wk   Review of Systems 13 point ROS : negative, except for increased snoring intensity    Objective:   Physical Exam  Constitutional: He is oriented to person, place, and time. He appears well-developed and well-nourished.  HENT:  Head: Normocephalic and atraumatic.  Right Ear: External ear normal.  Left Ear: External ear normal.  Mouth/Throat: Oropharynx is clear and moist.  Eyes: Pupils are equal, round, and reactive to light. Conjunctivae and EOM are normal.  Neck: Normal range of motion. Neck supple. No thyromegaly present.  Cardiovascular: Normal rate, regular rhythm, normal heart sounds and intact distal pulses.   Pulmonary/Chest:  Effort normal and breath sounds normal. No respiratory distress. He has no wheezes.  Abdominal: Soft. He exhibits no distension. There is no tenderness. Hernia confirmed negative in the right inguinal area and confirmed negative in the left inguinal area.  Genitourinary: Prostate normal.  Musculoskeletal: Normal range of motion. He exhibits no edema or tenderness.  Lymphadenopathy:    He has no cervical adenopathy.  Neurological: He is alert and oriented to person, place, and time. He has normal reflexes.  Skin: Skin is warm and dry.  Psychiatric: He has a normal mood and affect. His behavior is normal.  Vitals reviewed.    Vitals:   10/06/16 1527  BP: 127/81  Pulse: (!) 52  Resp: 16  Temp: 97.6 F (36.4 C)  TempSrc: Oral  SpO2: 97%  Weight: 201 lb 6.4 oz (91.4 kg)  Height: 5' 10.5" (1.791 m)      Assessment & Plan:   Patrick Willis is a 56 y.o. male Annual physical exam - Plan: Ambulatory referral to Sleep Studies  - -anticipatory guidance as below in AVS, screening labs above. Health maintenance items as above in HPI discussed/recommended as applicable.   Hyperglycemia - Plan: Comprehensive metabolic panel, Hemoglobin A1c  Borderline previously. Check A1c, CMP.  Snoring - Plan: Ambulatory referral to Sleep Studies Daytime somnolence - Plan: Ambulatory referral to Sleep Studies  -Increased snoring. Refer to sleep studies to test for possible OSA.  Screening for prostate cancer - Plan: PSA  - We discussed pros and cons of prostate cancer screening, and after this discussion, he chose to have screening done. PSA obtained, and no concerning findings on DRE.   Screening for hyperlipidemia - Plan: Lipid panel   No orders of the defined types were placed in this encounter.  Patient Instructions    I will refer you to the sleep specialist to decide on evaluation for snoring and possible sleep apnea testing. I will also check kidney, liver, cholesterol and prostate tests. I  did send off a 3 month blood sugar average as well.  Let me know if you have any questions.  Keeping you healthy  Get these tests  Blood pressure- Have your blood pressure checked once a year by your healthcare provider.  Normal blood pressure is 120/80  Weight- Have your body mass index (BMI) calculated to screen for obesity.  BMI is a measure of body fat based on height and weight. You can also calculate your own BMI at [email or web address].  Cholesterol- Have your cholesterol checked every year.  Diabetes- Have your blood sugar checked regularly if you have high blood pressure, high cholesterol, have a family history of diabetes or if you are overweight.  Screening for Colon Cancer- Colonoscopy starting at age 63.  Screening may begin sooner depending on your family history and other health conditions. Follow  up colonoscopy as directed by your Gastroenterologist.  Screening for Prostate Cancer- Both blood work (PSA) and a rectal exam help screen for Prostate Cancer.  Screening begins at age 56 with African-American men and at age 46 with Caucasian men.  Screening may begin sooner depending on your family history.  Take these medicines  Aspirin- One aspirin daily can help prevent Heart disease and Stroke.  Flu shot- Every fall.  Tetanus- Every 10 years.  Zostavax- Once after the age of 76 to prevent Shingles.  Pneumonia shot- Once after the age of 39; if you are younger than 37, ask your healthcare provider if you need a Pneumonia shot.  Take these steps  Don't smoke- If you do smoke, talk to your doctor about quitting.  For tips on how to quit, go to www.smokefree.gov or call 1-800-QUIT-NOW.  Be physically active- Exercise 5 days a week for at least 30 minutes.  If you are not already physically active start slow and gradually work up to 30 minutes of moderate physical activity.  Examples of moderate activity include walking briskly, mowing the yard, dancing, swimming,  bicycling, etc.  Eat a healthy diet- Eat a variety of healthy food such as fruits, vegetables, low fat milk, low fat cheese, yogurt, lean meant, poultry, fish, beans, tofu, etc. For more information go to www.thenutritionsource.org  Drink alcohol in moderation- Limit alcohol intake to less than two drinks a day. Never drink and drive.  Dentist- Brush and floss twice daily; visit your dentist twice a year.  Depression- Your emotional health is as important as your physical health. If you're feeling down, or losing interest in things you would normally enjoy please talk to your healthcare provider.  Eye exam- Visit your eye doctor every year.  Safe sex- If you may be exposed to a sexually transmitted infection, use a condom.  Seat belts- Seat belts can save your life; always wear one.  Smoke/Carbon Monoxide detectors- These detectors need to be installed on the appropriate level of your home.  Replace batteries at least once a year.  Skin cancer- When out in the sun, cover up and use sunscreen 15 SPF or higher.  Violence- If anyone is threatening you, please tell your healthcare provider.  Living Will/ Health care power of attorney- Speak with your healthcare provider and family.   IF you received an x-ray today, you will receive an invoice from [facility]. Please contact [facility] at [phone] with questions or concerns regarding your invoice.   IF you received labwork today, you will receive an invoice from [place]. Please contact [facility] at [phone] with questions or concerns regarding your invoice.   Our billing staff will not be able to assist you with questions regarding bills from these companies.  You will be contacted with the lab results as soon as they are available. The fastest way to get your results is to activate your My Chart account. Instructions are located on the last page of this paperwork. If you have not heard from [place] regarding the  results in 2 weeks, please contact this office.       I personally performed the services described in this documentation, which was scribed in my presence. The recorded information has been reviewed and considered for accuracy and completeness, addended by me as needed, and agree with information above.  Signed,   Merri Ray, MD [facility] [place].  10/07/16 2:00 PM

## 2016-10-07 ENCOUNTER — Encounter: Payer: Self-pay | Admitting: Family Medicine

## 2016-10-07 LAB — COMPREHENSIVE METABOLIC PANEL
A/G RATIO: 2 (ref 1.2–2.2)
ALT: 25 IU/L (ref 0–44)
AST: 19 IU/L (ref 0–40)
Albumin: 4.7 g/dL (ref 3.5–5.5)
Alkaline Phosphatase: 52 IU/L (ref 39–117)
BUN/Creatinine Ratio: 15 (ref 9–20)
BUN: 14 mg/dL (ref 6–24)
Bilirubin Total: 0.5 mg/dL (ref 0.0–1.2)
CO2: 21 mmol/L (ref 20–29)
Calcium: 9.7 mg/dL (ref 8.7–10.2)
Chloride: 103 mmol/L (ref 96–106)
Creatinine, Ser: 0.96 mg/dL (ref 0.76–1.27)
GFR calc Af Amer: 102 mL/min/{1.73_m2} (ref >59)
GFR calc non Af Amer: 89 mL/min/{1.73_m2} (ref >59)
GLOBULIN, TOTAL: 2.3 g/dL (ref 1.5–4.5)
Glucose: 108 mg/dL — ABNORMAL HIGH (ref 65–99)
POTASSIUM: 4.4 mmol/L (ref 3.5–5.2)
SODIUM: 141 mmol/L (ref 134–144)
Total Protein: 7 g/dL (ref 6.0–8.5)

## 2016-10-07 LAB — HEMOGLOBIN A1C
Est. average glucose Bld gHb Est-mCnc: 117 mg/dL
Hgb A1c MFr Bld: 5.7 % — ABNORMAL HIGH (ref 4.8–5.6)

## 2016-10-07 LAB — LIPID PANEL
CHOLESTEROL TOTAL: 190 mg/dL (ref 100–199)
Chol/HDL Ratio: 3.1 ratio (ref 0.0–5.0)
HDL: 62 mg/dL (ref >39)
LDL Calculated: 116 mg/dL — ABNORMAL HIGH (ref 0–99)
TRIGLYCERIDES: 61 mg/dL (ref 0–149)
VLDL Cholesterol Cal: 12 mg/dL (ref 5–40)

## 2016-10-07 LAB — PSA: PROSTATE SPECIFIC AG, SERUM: 0.7 ng/mL (ref 0.0–4.0)

## 2016-10-27 ENCOUNTER — Telehealth: Payer: Self-pay | Admitting: Family Medicine

## 2016-10-27 ENCOUNTER — Telehealth: Payer: Self-pay | Admitting: Neurology

## 2016-10-27 NOTE — Telephone Encounter (Signed)
We have attempted to call the patient 3 times to schedule sleep study. Patient has been unavailable at the phone numbers we have on file and has not returned our calls. At this point we will send a letter asking pt to please contact the sleep lab to schedule their sleep study. If patient calls back we will schedule them for their sleep study. ° °

## 2016-10-27 NOTE — Telephone Encounter (Signed)
Sheena from [facility] called stating that she spoke with the pt to schedule an appt and the pt declined to schedule stating that he is feeling fine and is not snoring anymore.

## 2017-10-24 ENCOUNTER — Other Ambulatory Visit: Payer: Self-pay

## 2017-10-24 ENCOUNTER — Encounter: Payer: Self-pay | Admitting: Family Medicine

## 2017-10-24 ENCOUNTER — Ambulatory Visit (INDEPENDENT_AMBULATORY_CARE_PROVIDER_SITE_OTHER): Payer: BLUE CROSS/BLUE SHIELD | Admitting: Family Medicine

## 2017-10-24 VITALS — BP 144/90 | HR 47 | Temp 98.3°F | Ht 70.0 in | Wt 209.8 lb

## 2017-10-24 DIAGNOSIS — Z125 Encounter for screening for malignant neoplasm of prostate: Secondary | ICD-10-CM

## 2017-10-24 DIAGNOSIS — R7303 Prediabetes: Secondary | ICD-10-CM | POA: Diagnosis not present

## 2017-10-24 DIAGNOSIS — Z13220 Encounter for screening for lipoid disorders: Secondary | ICD-10-CM

## 2017-10-24 DIAGNOSIS — Z23 Encounter for immunization: Secondary | ICD-10-CM | POA: Diagnosis not present

## 2017-10-24 DIAGNOSIS — Z6830 Body mass index (BMI) 30.0-30.9, adult: Secondary | ICD-10-CM

## 2017-10-24 DIAGNOSIS — Z0000 Encounter for general adult medical examination without abnormal findings: Secondary | ICD-10-CM

## 2017-10-24 DIAGNOSIS — R030 Elevated blood-pressure reading, without diagnosis of hypertension: Secondary | ICD-10-CM

## 2017-10-24 DIAGNOSIS — R0683 Snoring: Secondary | ICD-10-CM

## 2017-10-24 DIAGNOSIS — E669 Obesity, unspecified: Secondary | ICD-10-CM | POA: Diagnosis not present

## 2017-10-24 MED ORDER — ZOSTER VAC RECOMB ADJUVANTED 50 MCG/0.5ML IM SUSR: 0.5000 mL | Freq: Once | INTRAMUSCULAR | 1 refills | Status: AC

## 2017-10-24 NOTE — Progress Notes (Signed)
Subjective:  By signing my name below, I, Patrick Willis, attest that this documentation has been prepared under the direction and in the presence of Patrick Agreste, MD Electronically Signed: Delton Willis Medical Scribe 10/24/2017 at 10:27 AM   Patient ID: Patrick Willis, male    DOB: [birth year], 57 y.o.   MRN: [id number] Chief Complaint  Patient presents with  . Annual Exam    CPE    HPI Patrick Willis is a 57 y.o. male who presents to Primary Care at [facility] here for a physical.  Last physical was on July 2018. Patient is fasting this morning.Patient brought form to be filled out for work today.  Sleep Issue Blood pressure was normal as of last year. We did discuss his snoring at the atime and sleepiness after lunch. He did take power naps during the day. He was refererd for a sleep stydy but he did not schedule that appointmet, reportedly due to symptoms improving. Patient reports that after last physical visit he felt good and felt that sleep study wasn't worth pursing. He reports energy levels are good. He reports that after lunch he feels a little sleepy at times.   Prediabetes  Wt Readings from Last 3 Encounters:  10/24/17 209 lb 12.8 oz (95.2 kg)  10/06/16 201 lb 6.4 oz (91.4 kg)  09/03/15 197 lb 6.4 oz (89.5 kg)   Lab Results  Component Value Date   HGBA1C 5.7 (H) 10/06/2016   He reports that he drinks beer over the weekend, but not more than two. He denies eating fast food often, he feels that his weight gain may be due to recent vacation and eating more. He does reports having late night dinners. He usually has a banana and protein bar for lunch or breakfast. He reports that his blood pressure reading are around 120's/ 80's and 130/78 about a month ago.  CA Screening  Colonoscopy: 01/17/18 recheck in ten years. Prostate screening: Normal readings previously including July of last year. Agrees to testing.  Skin cancer: He is followed by dermatologist, biopsy of last year was  negative. And had a recheck about two months ago.  Immunization  There is no immunization history on file for this patient.  tetanus and shingles vaccines discussed. Patient is unaware of tetanus vaccinations recently. Agrees to shingles vaccine to be sent to pharmacy.   Depression screening Depression screen [facility] 2/9 10/24/2017 10/06/2016 09/03/2015 06/16/2014  Decreased Interest 0 0 0 0  Down, Depressed, Hopeless 0 0 0 0  PHQ - 2 Score 0 0 0 0   Vision Screening  Visual Acuity Screening   Right eye Left eye Both eyes  Without correction:     With correction: 20/20 20/20 20/15-1   Patient wears glasses, and last visit with opthalmologist was about 6 months ago.   Dentist Patient checks with dentist regualry, he reports he has an appointment tomorrow.    Exercise Patient reports walking about half an hour in the mornings, but denies doing weights. He reports having a FitBit and trying to get 10,000 steps a day.  There are no active problems to display for this patient.  No past medical history on file. Past Surgical History:  Procedure Laterality Date  . HERNIA REPAIR     No Known Allergies Prior to Admission medications   Medication Sig Start Date End Date Taking? Authorizing Provider  Multiple Vitamins-Minerals (MULTIVITAMIN WITH MINERALS) tablet Take 1 tablet by mouth daily.   Yes [provider]  Social History   Socioeconomic History  . Marital status: Married    Spouse name: Not on file  . Number of children: Not on file  . Years of education: Not on file  . Highest education level: Not on file  Occupational History  . Occupation: Biochemist, clinical  Social Needs  . Financial resource strain: Not on file  . Food insecurity:    Worry: Not on file    Inability: Not on file  . Transportation needs:    Medical: Not on file    Non-medical: Not on file  Tobacco Use  . Smoking status: Never Smoker  . Smokeless tobacco: Never Used  Substance and Sexual Activity    . Alcohol use: Yes    Alcohol/week: 0.0 oz  . Drug use: No  . Sexual activity: Not on file  Lifestyle  . Physical activity:    Days per week: Not on file    Minutes per session: Not on file  . Stress: Not on file  Relationships  . Social connections:    Talks on phone: Not on file    Gets together: Not on file    Attends religious service: Not on file    Active member of club or organization: Not on file    Attends meetings of clubs or organizations: Not on file    Relationship status: Not on file  . Intimate partner violence:    Fear of current or ex partner: Not on file    Emotionally abused: Not on file    Physically abused: Not on file    Forced sexual activity: Not on file  Other Topics Concern  . Not on file  Social History Narrative   Exercise walking x 2 miles four times/wk     Review of Systems 13 point ROS reviewed and negative.    Objective:   Physical Exam  Constitutional: He is oriented to person, place, and time. He appears well-developed and well-nourished.  HENT:  Head: Normocephalic and atraumatic.  Right Ear: External ear normal.  Left Ear: External ear normal.  Mouth/Throat: Oropharynx is clear and moist.  Eyes: Pupils are equal, round, and reactive to light. Conjunctivae and EOM are normal.  Neck: Normal range of motion. Neck supple. No thyromegaly present.  Cardiovascular: Normal rate, regular rhythm, normal heart sounds and intact distal pulses.  Pulmonary/Chest: Effort normal and breath sounds normal. No respiratory distress. He has no wheezes.  Abdominal: Soft. He exhibits no distension. There is no tenderness. Hernia confirmed negative in the right inguinal area and confirmed negative in the left inguinal area.  Genitourinary: Prostate normal.  Musculoskeletal: Normal range of motion. He exhibits no edema or tenderness.  Lymphadenopathy:    He has no cervical adenopathy.  Neurological: He is alert and oriented to person, place, and time. He  has normal reflexes.  Skin: Skin is warm and dry.  Psychiatric: He has a normal mood and affect. His behavior is normal.  Vitals reviewed.    Vitals:   10/24/17 0944 10/24/17 0952  BP: (!) 156/94 (!) 144/90  Pulse: (!) 47   Temp: 98.3 F (36.8 C)   TempSrc: Oral   SpO2: 97%   Weight: 209 lb 12.8 oz (95.2 kg)   Height: 5\' 10"  (1.778 m)       Assessment & Plan:    Eshaan Titzer is a 57 y.o. male Annual physical exam  - -anticipatory guidance as below in AVS, screening labs above. Health maintenance items as above in  HPI discussed/recommended as applicable.   Prediabetes - Plan: Lipid panel, Hemoglobin A1c  -Check A1c.  Diet/exercise discussed as treatment.  Class 1 obesity without serious comorbidity with body mass index (BMI) of 30.0 to 30.9 in adult, unspecified obesity type - Plan: Lipid panel  -As above diet/exercise discussed including portion sizes.  Screening for hyperlipidemia - Plan: Comprehensive metabolic panel, Lipid panel  Elevated blood pressure reading  -Recommended home monitoring and bring records to next visit.  Handout given  Snoring  -With elevated blood pressure reading still would recommend evaluation with sleep specialist.  Phone number provided as referral placed previously.  Screening for prostate cancer - Plan: PSA  -We discussed pros and cons of prostate cancer screening, and after this discussion, he chose to have screening done. PSA obtained, and no concerning findings on DRE.   Need for Tdap vaccination - Plan: Tdap vaccine greater than or equal to 7yo IM given  Need for shingles vaccine - Plan: Zoster Vaccine Adjuvanted [facility]) injection sent to pharmacy   Meds ordered this encounter  Medications  . Zoster Vaccine Adjuvanted [facility]) injection    Sig: Inject 0.5 mLs into the muscle once for 1 dose. Repeat in 2-6 months.    Dispense:  0.5 mL    Refill:  1   Patient Instructions    Weight has increased since last year. Watch  diet, avoid sugar containing beverages, watch portion sizes especially in the evening. continue some form of exercise most days per week. I will recheck the prediabetes test today.   I would recommend meeting with sleep specialist. Please call for appointment: [facility] at [facility]  Address: [address], [place], [place] [address]  Phone: [phone]  Blood pressure elevated today. Keep a record of your blood pressures outside of the office and bring them to the next office visit in next 3-4 weeks.   Thanks for coming in today.    How to Take Your Blood Pressure You can take your blood pressure at home with a machine. You may need to check your blood pressure at home:  To check if you have high blood pressure (hypertension).  To check your blood pressure over time.  To make sure your blood pressure medicine is working.  Supplies needed: You will need a blood pressure machine, or monitor. You can buy one at a drugstore or online. When choosing one:  Choose one with an arm cuff.  Choose one that wraps around your upper arm. Only one finger should fit between your arm and the cuff.  Do not choose one that measures your blood pressure from your wrist or finger.  Your doctor can suggest a monitor. How to prepare Avoid these things for 30 minutes before checking your blood pressure:  Drinking caffeine.  Drinking alcohol.  Eating.  Smoking.  Exercising.  Five minutes before checking your blood pressure:  Pee.  Sit in a dining chair. Avoid sitting in a soft couch or armchair.  Be quiet. Do not talk.  How to take your blood pressure Follow the instructions that came with your machine. If you have a digital blood pressure monitor, these may be the instructions: 1. Sit up straight. 2. Place your feet on the floor. Do not cross your ankles or legs. 3. Rest your left arm at the level of your heart. You may rest it on a table, desk, or chair. 4. Pull  up your shirt sleeve. 5. Wrap the blood pressure cuff around the upper  part of your left arm. The cuff should be 1 inch (2.5 cm) above your elbow. It is best to wrap the cuff around bare skin. 6. Fit the cuff snugly around your arm. You should be able to place only one finger between the cuff and your arm. 7. Put the cord inside the groove of your elbow. 8. Press the power button. 9. Sit quietly while the cuff fills with air and loses air. 10. Write down the numbers on the screen. 11. Wait 2-3 minutes and then repeat steps 1-10.  What do the numbers mean? Two numbers make up your blood pressure. The first number is called systolic pressure. The second is called diastolic pressure. An example of a blood pressure reading is "120 over 80" (or 120/80). If you are an adult and do not have a medical condition, use this guide to find out if your blood pressure is normal: Normal  First number: below 120.  Second number: below 80. Elevated  First number: 120-129.  Second number: below 80. Hypertension stage 1  First number: 130-139.  Second number: 80-89. Hypertension stage 2  First number: 140 or above.  Second number: 77 or above. Your blood pressure is above normal even if only the top or bottom number is above normal. Follow these instructions at home:  Check your blood pressure as often as your doctor tells you to.  Take your monitor to your next doctor's appointment. Your doctor will: ? Make sure you are using it correctly. ? Make sure it is working right.  Make sure you understand what your blood pressure numbers should be.  Tell your doctor if your medicines are causing side effects. Contact a doctor if:  Your blood pressure keeps being high. Get help right away if:  Your first blood pressure number is higher than 180.  Your second blood pressure number is higher than 120. This information is not intended to replace advice given to you by your health care provider.  Make sure you discuss any questions you have with your health care provider. Document Released: 02/25/2008 Document Revised: 02/10/2016 Document Reviewed: 08/21/2015 Elsevier Interactive Patient Education  2018 [place] you healthy  Get these tests  Blood pressure- Have your blood pressure checked once a year by your healthcare provider.  Normal blood pressure is 120/80  Weight- Have your body mass index (BMI) calculated to screen for obesity.  BMI is a measure of body fat based on height and weight. You can also calculate your own BMI at ViewBanking.[email or web address].  Cholesterol- Have your cholesterol checked every year.  Diabetes- Have your blood sugar checked regularly if you have high blood pressure, high cholesterol, have a family history of diabetes or if you are overweight.  Screening for Colon Cancer- Colonoscopy starting at age 71.  Screening may begin sooner depending on your family history and other health conditions. Follow up colonoscopy as directed by your Gastroenterologist.  Screening for Prostate Cancer- Both blood work (PSA) and a rectal exam help screen for Prostate Cancer.  Screening begins at age 41 with African-American men and at age 60 with Caucasian men.  Screening may begin sooner depending on your family history.  Take these medicines  Aspirin- One aspirin daily can help prevent Heart disease and Stroke.  Flu shot- Every fall.  Tetanus- Every 10 years.  Zostavax- Once after the age of 61 to prevent Shingles.  Pneumonia shot- Once after the age of 67; if you are younger than 70, ask  your healthcare provider if you need a Pneumonia shot.  Take these steps  Don't smoke- If you do smoke, talk to your doctor about quitting.  For tips on how to quit, go to www.smokefree.gov or call 1-800-QUIT-NOW.  Be physically active- Exercise 5 days a week for at least 30 minutes.  If you are not already physically active start slow and gradually work up to 30  minutes of moderate physical activity.  Examples of moderate activity include walking briskly, mowing the yard, dancing, swimming, bicycling, etc.  Eat a healthy diet- Eat a variety of healthy food such as fruits, vegetables, low fat milk, low fat cheese, yogurt, lean meant, poultry, fish, beans, tofu, etc. For more information go to www.thenutritionsource.org  Drink alcohol in moderation- Limit alcohol intake to less than two drinks a day. Never drink and drive.  Dentist- Brush and floss twice daily; visit your dentist twice a year.  Depression- Your emotional health is as important as your physical health. If you're feeling down, or losing interest in things you would normally enjoy please talk to your healthcare provider.  Eye exam- Visit your eye doctor every year.  Safe sex- If you may be exposed to a sexually transmitted infection, use a condom.  Seat belts- Seat belts can save your life; always wear one.  Smoke/Carbon Monoxide detectors- These detectors need to be installed on the appropriate level of your home.  Replace batteries at least once a year.  Skin cancer- When out in the sun, cover up and use sunscreen 15 SPF or higher.  Violence- If anyone is threatening you, please tell your healthcare provider.  Living Will/ Health care power of attorney- Speak with your healthcare provider and family.    IF you received an x-ray today, you will receive an invoice from [facility]. Please contact [facility] at [phone] with questions or concerns regarding your invoice.   IF you received labwork today, you will receive an invoice from [place]. Please contact [facility] at [phone] with questions or concerns regarding your invoice.   Our billing staff will not be able to assist you with questions regarding bills from these companies.  You will be contacted with the lab results as soon as they are available. The fastest way to get your results is to activate  your My Chart account. Instructions are located on the last page of this paperwork. If you have not heard from [place] regarding the results in 2 weeks, please contact this office.       I personally performed the services described in this documentation, which was scribed in my presence. The recorded information has been reviewed and considered for accuracy and completeness, addended by me as needed, and agree with information above.  Signed,   Merri Ray, MD [facility] at [facility].  10/27/17 12:41 PM

## 2017-10-24 NOTE — Patient Instructions (Addendum)
Weight has increased since last year. Watch diet, avoid sugar containing beverages, watch portion sizes especially in the evening. continue some form of exercise most days per week. I will recheck the prediabetes test today.   I would recommend meeting with sleep specialist. Please call for appointment: [facility] at [facility]  Address: [address] [place], [place] [address]  Phone: [phone]  Blood pressure elevated today. Keep a record of your blood pressures outside of the office and bring them to the next office visit in next 3-4 weeks.   Thanks for coming in today.    How to Take Your Blood Pressure You can take your blood pressure at home with a machine. You may need to check your blood pressure at home:  To check if you have high blood pressure (hypertension).  To check your blood pressure over time.  To make sure your blood pressure medicine is working.  Supplies needed: You will need a blood pressure machine, or monitor. You can buy one at a drugstore or online. When choosing one:  Choose one with an arm cuff.  Choose one that wraps around your upper arm. Only one finger should fit between your arm and the cuff.  Do not choose one that measures your blood pressure from your wrist or finger.  Your doctor can suggest a monitor. How to prepare Avoid these things for 30 minutes before checking your blood pressure:  Drinking caffeine.  Drinking alcohol.  Eating.  Smoking.  Exercising.  Five minutes before checking your blood pressure:  Pee.  Sit in a dining chair. Avoid sitting in a soft couch or armchair.  Be quiet. Do not talk.  How to take your blood pressure Follow the instructions that came with your machine. If you have a digital blood pressure monitor, these may be the instructions: 1. Sit up straight. 2. Place your feet on the floor. Do not cross your ankles or legs. 3. Rest your left arm at the level of your heart. You may  rest it on a table, desk, or chair. 4. Pull up your shirt sleeve. 5. Wrap the blood pressure cuff around the upper part of your left arm. The cuff should be 1 inch (2.5 cm) above your elbow. It is best to wrap the cuff around bare skin. 6. Fit the cuff snugly around your arm. You should be able to place only one finger between the cuff and your arm. 7. Put the cord inside the groove of your elbow. 8. Press the power button. 9. Sit quietly while the cuff fills with air and loses air. 10. Write down the numbers on the screen. 11. Wait 2-3 minutes and then repeat steps 1-10.  What do the numbers mean? Two numbers make up your blood pressure. The first number is called systolic pressure. The second is called diastolic pressure. An example of a blood pressure reading is "120 over 80" (or 120/80). If you are an adult and do not have a medical condition, use this guide to find out if your blood pressure is normal: Normal  First number: below 120.  Second number: below 80. Elevated  First number: 120-129.  Second number: below 80. Hypertension stage 1  First number: 130-139.  Second number: 80-89. Hypertension stage 2  First number: 140 or above.  Second number: 12 or above. Your blood pressure is above normal even if only the top or bottom number is above normal. Follow these instructions at home:  Check your blood  pressure as often as your doctor tells you to.  Take your monitor to your next doctor's appointment. Your doctor will: ? Make sure you are using it correctly. ? Make sure it is working right.  Make sure you understand what your blood pressure numbers should be.  Tell your doctor if your medicines are causing side effects. Contact a doctor if:  Your blood pressure keeps being high. Get help right away if:  Your first blood pressure number is higher than 180.  Your second blood pressure number is higher than 120. This information is not intended to replace  advice given to you by your health care provider. Make sure you discuss any questions you have with your health care provider. Document Released: 02/25/2008 Document Revised: 02/10/2016 Document Reviewed: 08/21/2015 Elsevier Interactive Patient Education  2018 [place] you healthy  Get these tests  Blood pressure- Have your blood pressure checked once a year by your healthcare provider.  Normal blood pressure is 120/80  Weight- Have your body mass index (BMI) calculated to screen for obesity.  BMI is a measure of body fat based on height and weight. You can also calculate your own BMI at ViewBanking.si.  Cholesterol- Have your cholesterol checked every year.  Diabetes- Have your blood sugar checked regularly if you have high blood pressure, high cholesterol, have a family history of diabetes or if you are overweight.  Screening for Colon Cancer- Colonoscopy starting at age 17.  Screening may begin sooner depending on your family history and other health conditions. Follow up colonoscopy as directed by your Gastroenterologist.  Screening for Prostate Cancer- Both blood work (PSA) and a rectal exam help screen for Prostate Cancer.  Screening begins at age 68 with African-American men and at age 61 with Caucasian men.  Screening may begin sooner depending on your family history.  Take these medicines  Aspirin- One aspirin daily can help prevent Heart disease and Stroke.  Flu shot- Every fall.  Tetanus- Every 10 years.  Zostavax- Once after the age of 6 to prevent Shingles.  Pneumonia shot- Once after the age of 97; if you are younger than 21, ask your healthcare provider if you need a Pneumonia shot.  Take these steps  Don't smoke- If you do smoke, talk to your doctor about quitting.  For tips on how to quit, go to www.smokefree.gov or call 1-800-QUIT-NOW.  Be physically active- Exercise 5 days a week for at least 30 minutes.  If you are not already  physically active start slow and gradually work up to 30 minutes of moderate physical activity.  Examples of moderate activity include walking briskly, mowing the yard, dancing, swimming, bicycling, etc.  Eat a healthy diet- Eat a variety of healthy food such as fruits, vegetables, low fat milk, low fat cheese, yogurt, lean meant, poultry, fish, beans, tofu, etc. For more information go to www.thenutritionsource.org  Drink alcohol in moderation- Limit alcohol intake to less than two drinks a day. Never drink and drive.  Dentist- Brush and floss twice daily; visit your dentist twice a year.  Depression- Your emotional health is as important as your physical health. If you're feeling down, or losing interest in things you would normally enjoy please talk to your healthcare provider.  Eye exam- Visit your eye doctor every year.  Safe sex- If you may be exposed to a sexually transmitted infection, use a condom.  Seat belts- Seat belts can save your life; always wear one.  Smoke/Carbon Monoxide detectors-  These detectors need to be installed on the appropriate level of your home.  Replace batteries at least once a year.  Skin cancer- When out in the sun, cover up and use sunscreen 15 SPF or higher.  Violence- If anyone is threatening you, please tell your healthcare provider.  Living Will/ Health care power of attorney- Speak with your healthcare provider and family.    IF you received an x-ray today, you will receive an invoice from [facility]. Please contact [facility] at [phone] with questions or concerns regarding your invoice.   IF you received labwork today, you will receive an invoice from [facility]. Please contact [facility] at [phone] with questions or concerns regarding your invoice.   Our billing staff will not be able to assist you with questions regarding bills from these companies.  You will be contacted with the lab results as soon as they are  available. The fastest way to get your results is to activate your My Chart account. Instructions are located on the last page of this paperwork. If you have not heard from [place] regarding the results in 2 weeks, please contact this office.

## 2017-10-25 LAB — COMPREHENSIVE METABOLIC PANEL
A/G RATIO: 1.9 (ref 1.2–2.2)
ALT: 25 IU/L (ref 0–44)
AST: 21 IU/L (ref 0–40)
Albumin: 4.7 g/dL (ref 3.5–5.5)
Alkaline Phosphatase: 55 IU/L (ref 39–117)
BILIRUBIN TOTAL: 0.5 mg/dL (ref 0.0–1.2)
BUN/Creatinine Ratio: 13 (ref 9–20)
BUN: 11 mg/dL (ref 6–24)
CHLORIDE: 103 mmol/L (ref 96–106)
CO2: 24 mmol/L (ref 20–29)
Calcium: 9.3 mg/dL (ref 8.7–10.2)
Creatinine, Ser: 0.82 mg/dL (ref 0.76–1.27)
GFR calc non Af Amer: 99 mL/min/{1.73_m2} (ref >59)
GFR, EST AFRICAN AMERICAN: 114 mL/min/{1.73_m2} (ref >59)
GLUCOSE: 110 mg/dL — AB (ref 65–99)
Globulin, Total: 2.5 g/dL (ref 1.5–4.5)
POTASSIUM: 4.4 mmol/L (ref 3.5–5.2)
Sodium: 141 mmol/L (ref 134–144)
TOTAL PROTEIN: 7.2 g/dL (ref 6.0–8.5)

## 2017-10-25 LAB — LIPID PANEL
CHOLESTEROL TOTAL: 194 mg/dL (ref 100–199)
Chol/HDL Ratio: 3 ratio (ref 0.0–5.0)
HDL: 65 mg/dL (ref >39)
LDL Calculated: 114 mg/dL — ABNORMAL HIGH (ref 0–99)
TRIGLYCERIDES: 76 mg/dL (ref 0–149)
VLDL Cholesterol Cal: 15 mg/dL (ref 5–40)

## 2017-10-25 LAB — HEMOGLOBIN A1C
Est. average glucose Bld gHb Est-mCnc: 120 mg/dL
Hgb A1c MFr Bld: 5.8 % — ABNORMAL HIGH (ref 4.8–5.6)

## 2017-10-25 LAB — PSA: Prostate Specific Ag, Serum: 0.8 ng/mL (ref 0.0–4.0)

## 2017-10-27 ENCOUNTER — Telehealth: Payer: Self-pay | Admitting: Family Medicine

## 2017-10-27 NOTE — Telephone Encounter (Signed)
Pt came by to see if his physical form was ready to pick up.  He assumed it would be because he saw his mychart results.  He would like [place] to email or send it to him in [place].  If not he will be back by next Wednesday. [phone]

## 2017-11-03 NOTE — Telephone Encounter (Signed)
Message re: pt's physical form ? Completed = sent to Dr. Carlota Raspberry

## 2017-11-10 NOTE — Telephone Encounter (Signed)
I checked again and do not see his form in my inbox.  Has this been taken care of?  If not, I can complete another form if needed. Thanks.

## 2017-11-20 NOTE — Telephone Encounter (Signed)
lm

## 2017-11-21 ENCOUNTER — Ambulatory Visit (INDEPENDENT_AMBULATORY_CARE_PROVIDER_SITE_OTHER): Payer: BLUE CROSS/BLUE SHIELD | Admitting: Family Medicine

## 2017-11-21 ENCOUNTER — Other Ambulatory Visit: Payer: Self-pay

## 2017-11-21 ENCOUNTER — Encounter: Payer: Self-pay | Admitting: Family Medicine

## 2017-11-21 VITALS — BP 138/88 | HR 58 | Temp 98.1°F | Ht 70.5 in | Wt 205.8 lb

## 2017-11-21 DIAGNOSIS — R030 Elevated blood-pressure reading, without diagnosis of hypertension: Secondary | ICD-10-CM | POA: Diagnosis not present

## 2017-11-21 NOTE — Telephone Encounter (Signed)
Pt is here for appointment so he can speak to provider about the form.

## 2017-11-21 NOTE — Telephone Encounter (Signed)
Spoke to the pt and he stated that his emplyer did get the forms. This has been taken care of.  Also he is switching jobs next month.

## 2017-11-21 NOTE — Progress Notes (Signed)
Subjective:  By signing my name below, I, Essence Howell, attest that this documentation has been prepared under the direction and in the presence of [name], MD Electronically Signed: Ladene Artist, ED Scribe 11/21/2017 at 11:21 AM.   Patient ID: Patrick Willis, male    DOB: June 07, 1960, 57 y.o.   MRN: [id number]  Chief Complaint  Patient presents with  . Blood Pressure Check    1 m f/u    HPI Patrick Willis is a 57 y.o. male who presents to Primary Care at [facility] for f/u. Last seen 7/30 for CPE. Multiple other concerns discussed.  Elevated BP BP Readings from Last 3 Encounters:  11/21/17 138/88  10/24/17 (!) 144/90  10/06/16 127/81  Not on meds, recommended home monitoring, low intensity exercise. Here for recheck. - Pt reports readings of 124/82 2 wks ago when he gave blood. Denies cp, lightheadedness, dizziness, HA. Pt has not made any changes in diet or exercise yet but plans to.  Had snoring and concern for poss OSA. Referred to sleep specialist. - Pt states that he is planning to have a sleep study done in Sept but he is awaiting for their off to get back with him to confirm.  There are no active problems to display for this patient.  No past medical history on file. Past Surgical History:  Procedure Laterality Date  . HERNIA REPAIR     No Known Allergies Prior to Admission medications   Medication Sig Start Date End Date Taking? Authorizing Provider  Multiple Vitamins-Minerals (MULTIVITAMIN WITH MINERALS) tablet Take 1 tablet by mouth daily.    [provider]   Social History   Socioeconomic History  . Marital status: Married    Spouse name: Not on file  . Number of children: Not on file  . Years of education: Not on file  . Highest education level: Not on file  Occupational History  . Occupation: Biochemist, clinical  Social Needs  . Financial resource strain: Not on file  . Food insecurity:    Worry: Not on file    Inability: Not on file  .  Transportation needs:    Medical: Not on file    Non-medical: Not on file  Tobacco Use  . Smoking status: Never Smoker  . Smokeless tobacco: Never Used  Substance and Sexual Activity  . Alcohol use: Yes    Alcohol/week: 0.0 standard drinks  . Drug use: No  . Sexual activity: Not on file  Lifestyle  . Physical activity:    Days per week: Not on file    Minutes per session: Not on file  . Stress: Not on file  Relationships  . Social connections:    Talks on phone: Not on file    Gets together: Not on file    Attends religious service: Not on file    Active member of club or organization: Not on file    Attends meetings of clubs or organizations: Not on file    Relationship status: Not on file  . Intimate partner violence:    Fear of current or ex partner: Not on file    Emotionally abused: Not on file    Physically abused: Not on file    Forced sexual activity: Not on file  Other Topics Concern  . Not on file  Social History Narrative   Exercise walking x 2 miles four times/wk   Review of Systems  Constitutional: Negative for fatigue and unexpected weight change.  Eyes: Negative  for visual disturbance.  Respiratory: Negative for cough, chest tightness and shortness of breath.   Cardiovascular: Negative for chest pain, palpitations and leg swelling.  Gastrointestinal: Negative for abdominal pain and blood in stool.  Neurological: Negative for dizziness, light-headedness and headaches.      Objective:   Physical Exam  Constitutional: He is oriented to person, place, and time. He appears well-developed and well-nourished.  HENT:  Head: Normocephalic and atraumatic.  Eyes: Pupils are equal, round, and reactive to light. EOM are normal.  Neck: No JVD present. Carotid bruit is not present.  Cardiovascular: Normal rate, regular rhythm and normal heart sounds.  No murmur heard. Pulmonary/Chest: Effort normal and breath sounds normal. He has no rales.  Musculoskeletal: He  exhibits no edema.  Neurological: He is alert and oriented to person, place, and time.  Skin: Skin is warm and dry.  Psychiatric: He has a normal mood and affect.  Vitals reviewed.   Vitals:   11/21/17 1056 11/21/17 1059  BP: (!) 146/92 138/88  Pulse: (!) 58   Temp: 98.1 F (36.7 C)   TempSrc: Oral   SpO2: 99%   Weight: 205 lb 12.8 oz (93.4 kg)   Height: 5' 10.5" (1.791 m)       Assessment & Plan:   Patrick Willis is a 57 y.o. male Elevated blood pressure reading  -Still borderline elevated, but decided against starting medications at this time.  Diet and sodium discussed, handout given on hypertension as well as how to check blood pressure at home.  If elevated readings return to discuss medications.  RTC precautions.  Additionally recommended following up with sleep specialist, as untreated OSA could also impact his blood pressure.  No orders of the defined types were placed in this encounter.  Patient Instructions    Blood pressure still running on higher end, but do not think meds are indicated at this time. Check readings at home and if remaining over 140/90 - return to discuss meds.   Try to avoid excess sodium in diet, and see other information on hypertension.   Keep up the good work with weight loss as that will help blood pressure and blood sugar. recheck in 3 months.   Thanks for coming in today.  Hypertension Hypertension, commonly called high blood pressure, is when the force of blood pumping through the arteries is too strong. The arteries are the blood vessels that carry blood from the heart throughout the body. Hypertension forces the heart to work harder to pump blood and may cause arteries to become narrow or stiff. Having untreated or uncontrolled hypertension can cause heart attacks, strokes, kidney disease, and other problems. A blood pressure reading consists of a higher number over a lower number. Ideally, your blood pressure should be below 120/80. The first  ("top") number is called the systolic pressure. It is a measure of the pressure in your arteries as your heart beats. The second ("bottom") number is called the diastolic pressure. It is a measure of the pressure in your arteries as the heart relaxes. What are the causes? The cause of this condition is not known. What increases the risk? Some risk factors for high blood pressure are under your control. Others are not. Factors you can change  Smoking.  Having type 2 diabetes mellitus, high cholesterol, or both.  Not getting enough exercise or physical activity.  Being overweight.  Having too much fat, sugar, calories, or salt (sodium) in your diet.  Drinking too much alcohol. Factors that  are difficult or impossible to change  Having chronic kidney disease.  Having a family history of high blood pressure.  Age. Risk increases with age.  Race. You may be at higher risk if you are African-American.  Gender. Men are at higher risk than women before age 57. After age 60, women are at higher risk than men.  Having obstructive sleep apnea.  Stress. What are the signs or symptoms? Extremely high blood pressure (hypertensive crisis) may cause:  Headache.  Anxiety.  Shortness of breath.  Nosebleed.  Nausea and vomiting.  Severe chest pain.  Jerky movements you cannot control (seizures).  How is this diagnosed? This condition is diagnosed by measuring your blood pressure while you are seated, with your arm resting on a surface. The cuff of the blood pressure monitor will be placed directly against the skin of your upper arm at the level of your heart. It should be measured at least twice using the same arm. Certain conditions can cause a difference in blood pressure between your right and left arms. Certain factors can cause blood pressure readings to be lower or higher than normal (elevated) for a short period of time:  When your blood pressure is higher when you are in a  health care provider's office than when you are at home, this is called white coat hypertension. Most people with this condition do not need medicines.  When your blood pressure is higher at home than when you are in a health care provider's office, this is called masked hypertension. Most people with this condition may need medicines to control blood pressure.  If you have a high blood pressure reading during one visit or you have normal blood pressure with other risk factors:  You may be asked to return on a different day to have your blood pressure checked again.  You may be asked to monitor your blood pressure at home for 1 week or longer.  If you are diagnosed with hypertension, you may have other blood or imaging tests to help your health care provider understand your overall risk for other conditions. How is this treated? This condition is treated by making healthy lifestyle changes, such as eating healthy foods, exercising more, and reducing your alcohol intake. Your health care provider may prescribe medicine if lifestyle changes are not enough to get your blood pressure under control, and if:  Your systolic blood pressure is above 130.  Your diastolic blood pressure is above 80.  Your personal target blood pressure may vary depending on your medical conditions, your age, and other factors. Follow these instructions at home: Eating and drinking  Eat a diet that is high in fiber and potassium, and low in sodium, added sugar, and fat. An example eating plan is called the DASH (Dietary Approaches to Stop Hypertension) diet. To eat this way: ? Eat plenty of fresh fruits and vegetables. Try to fill half of your plate at each meal with fruits and vegetables. ? Eat whole grains, such as whole wheat pasta, brown rice, or whole grain bread. Fill about one quarter of your plate with whole grains. ? Eat or drink low-fat dairy products, such as skim milk or low-fat yogurt. ? Avoid fatty cuts  of meat, processed or cured meats, and poultry with skin. Fill about one quarter of your plate with lean proteins, such as fish, chicken without skin, beans, eggs, and tofu. ? Avoid premade and processed foods. These tend to be higher in sodium, added sugar, and fat.  Reduce your daily sodium intake. Most people with hypertension should eat less than 1,500 mg of sodium a day.  Limit alcohol intake to no more than 1 drink a day for nonpregnant women and 2 drinks a day for men. One drink equals 12 oz of beer, 5 oz of wine, or 1 oz of hard liquor. Lifestyle  Work with your health care provider to maintain a healthy body weight or to lose weight. Ask what an ideal weight is for you.  Get at least 30 minutes of exercise that causes your heart to beat faster (aerobic exercise) most days of the week. Activities may include walking, swimming, or biking.  Include exercise to strengthen your muscles (resistance exercise), such as pilates or lifting weights, as part of your weekly exercise routine. Try to do these types of exercises for 30 minutes at least 3 days a week.  Do not use any products that contain nicotine or tobacco, such as cigarettes and e-cigarettes. If you need help quitting, ask your health care provider.  Monitor your blood pressure at home as told by your health care provider.  Keep all follow-up visits as told by your health care provider. This is important. Medicines  Take over-the-counter and prescription medicines only as told by your health care provider. Follow directions carefully. Blood pressure medicines must be taken as prescribed.  Do not skip doses of blood pressure medicine. Doing this puts you at risk for problems and can make the medicine less effective.  Ask your health care provider about side effects or reactions to medicines that you should watch for. Contact a health care provider if:  You think you are having a reaction to a medicine you are taking.  You  have headaches that keep coming back (recurring).  You feel dizzy.  You have swelling in your ankles.  You have trouble with your vision. Get help right away if:  You develop a severe headache or confusion.  You have unusual weakness or numbness.  You feel faint.  You have severe pain in your chest or abdomen.  You vomit repeatedly.  You have trouble breathing. Summary  Hypertension is when the force of blood pumping through your arteries is too strong. If this condition is not controlled, it may put you at risk for serious complications.  Your personal target blood pressure may vary depending on your medical conditions, your age, and other factors. For most people, a normal blood pressure is less than 120/80.  Hypertension is treated with lifestyle changes, medicines, or a combination of both. Lifestyle changes include weight loss, eating a healthy, low-sodium diet, exercising more, and limiting alcohol. This information is not intended to replace advice given to you by your health care provider. Make sure you discuss any questions you have with your health care provider. Document Released: 03/14/2005 Document Revised: 02/10/2016 Document Reviewed: 02/10/2016 Elsevier Interactive Patient Education  2018 [employer].   How to Take Your Blood Pressure You can take your blood pressure at home with a machine. You may need to check your blood pressure at home:  To check if you have high blood pressure (hypertension).  To check your blood pressure over time.  To make sure your blood pressure medicine is working.  Supplies needed: You will need a blood pressure machine, or monitor. You can buy one at a drugstore or online. When choosing one:  Choose one with an arm cuff.  Choose one that wraps around your upper arm. Only one finger should  fit between your arm and the cuff.  Do not choose one that measures your blood pressure from your wrist or finger.  Your doctor can  suggest a monitor. How to prepare Avoid these things for 30 minutes before checking your blood pressure:  Drinking caffeine.  Drinking alcohol.  Eating.  Smoking.  Exercising.  Five minutes before checking your blood pressure:  Pee.  Sit in a dining chair. Avoid sitting in a soft couch or armchair.  Be quiet. Do not talk.  How to take your blood pressure Follow the instructions that came with your machine. If you have a digital blood pressure monitor, these may be the instructions: 1. Sit up straight. 2. Place your feet on the floor. Do not cross your ankles or legs. 3. Rest your left arm at the level of your heart. You may rest it on a table, desk, or chair. 4. Pull up your shirt sleeve. 5. Wrap the blood pressure cuff around the upper part of your left arm. The cuff should be 1 inch (2.5 cm) above your elbow. It is best to wrap the cuff around bare skin. 6. Fit the cuff snugly around your arm. You should be able to place only one finger between the cuff and your arm. 7. Put the cord inside the groove of your elbow. 8. Press the power button. 9. Sit quietly while the cuff fills with air and loses air. 10. Write down the numbers on the screen. 11. Wait 2-3 minutes and then repeat steps 1-10.  What do the numbers mean? Two numbers make up your blood pressure. The first number is called systolic pressure. The second is called diastolic pressure. An example of a blood pressure reading is "120 over 80" (or 120/80). If you are an adult and do not have a medical condition, use this guide to find out if your blood pressure is normal: Normal  First number: below 120.  Second number: below 80. Elevated  First number: 120-129.  Second number: below 80. Hypertension stage 1  First number: 130-139.  Second number: 80-89. Hypertension stage 2  First number: 140 or above.  Second number: 72 or above. Your blood pressure is above normal even if only the top or bottom  number is above normal. Follow these instructions at home:  Check your blood pressure as often as your doctor tells you to.  Take your monitor to your next doctor's appointment. Your doctor will: ? Make sure you are using it correctly. ? Make sure it is working right.  Make sure you understand what your blood pressure numbers should be.  Tell your doctor if your medicines are causing side effects. Contact a doctor if:  Your blood pressure keeps being high. Get help right away if:  Your first blood pressure number is higher than 180.  Your second blood pressure number is higher than 120. This information is not intended to replace advice given to you by your health care provider. Make sure you discuss any questions you have with your health care provider. Document Released: 02/25/2008 Document Revised: 02/10/2016 Document Reviewed: 08/21/2015 Elsevier Interactive Patient Education  [employer].    If you have lab work done today you will be contacted with your lab results within the next 2 weeks.  If you have not heard from [place] then please contact us. The fastest way to get your results is to register for My Chart.   IF you received an x-ray today, you will receive an invoice  from [facility]. Please contact [facility] at [phone] with questions or concerns regarding your invoice.   IF you received labwork today, you will receive an invoice from [place]. Please contact [facility] at [phone] with questions or concerns regarding your invoice.   Our billing staff will not be able to assist you with questions regarding bills from these companies.  You will be contacted with the lab results as soon as they are available. The fastest way to get your results is to activate your My Chart account. Instructions are located on the last page of this paperwork. If you have not heard from [place] regarding the results in 2 weeks, please contact this office.        I personally performed the services described in this documentation, which was scribed in my presence. The recorded information has been reviewed and considered for accuracy and completeness, addended by me as needed, and agree with information above.  Signed,   Merri Ray, MD [facility] at [place].  11/25/17 3:15 PM

## 2017-11-21 NOTE — Patient Instructions (Addendum)
Blood pressure still running on higher end, but do not think meds are indicated at this time. Check readings at home and if remaining over 140/90 - return to discuss meds.   Try to avoid excess sodium in diet, and see other information on hypertension.   Keep up the good work with weight loss as that will help blood pressure and blood sugar. recheck in 3 months.   Thanks for coming in today.  Hypertension Hypertension, commonly called high blood pressure, is when the force of blood pumping through the arteries is too strong. The arteries are the blood vessels that carry blood from the heart throughout the body. Hypertension forces the heart to work harder to pump blood and may cause arteries to become narrow or stiff. Having untreated or uncontrolled hypertension can cause heart attacks, strokes, kidney disease, and other problems. A blood pressure reading consists of a higher number over a lower number. Ideally, your blood pressure should be below 120/80. The first ("top") number is called the systolic pressure. It is a measure of the pressure in your arteries as your heart beats. The second ("bottom") number is called the diastolic pressure. It is a measure of the pressure in your arteries as the heart relaxes. What are the causes? The cause of this condition is not known. What increases the risk? Some risk factors for high blood pressure are under your control. Others are not. Factors you can change  Smoking.  Having type 2 diabetes mellitus, high cholesterol, or both.  Not getting enough exercise or physical activity.  Being overweight.  Having too much fat, sugar, calories, or salt (sodium) in your diet.  Drinking too much alcohol. Factors that are difficult or impossible to change  Having chronic kidney disease.  Having a family history of high blood pressure.  Age. Risk increases with age.  Race. You may be at higher risk if you are African-American.  Gender. Men are at  higher risk than women before age 52. After age 68, women are at higher risk than men.  Having obstructive sleep apnea.  Stress. What are the signs or symptoms? Extremely high blood pressure (hypertensive crisis) may cause:  Headache.  Anxiety.  Shortness of breath.  Nosebleed.  Nausea and vomiting.  Severe chest pain.  Jerky movements you cannot control (seizures).  How is this diagnosed? This condition is diagnosed by measuring your blood pressure while you are seated, with your arm resting on a surface. The cuff of the blood pressure monitor will be placed directly against the skin of your upper arm at the level of your heart. It should be measured at least twice using the same arm. Certain conditions can cause a difference in blood pressure between your right and left arms. Certain factors can cause blood pressure readings to be lower or higher than normal (elevated) for a short period of time:  When your blood pressure is higher when you are in a health care provider's office than when you are at home, this is called white coat hypertension. Most people with this condition do not need medicines.  When your blood pressure is higher at home than when you are in a health care provider's office, this is called masked hypertension. Most people with this condition may need medicines to control blood pressure.  If you have a high blood pressure reading during one visit or you have normal blood pressure with other risk factors:  You may be asked to return on a different day to  have your blood pressure checked again.  You may be asked to monitor your blood pressure at home for 1 week or longer.  If you are diagnosed with hypertension, you may have other blood or imaging tests to help your health care provider understand your overall risk for other conditions. How is this treated? This condition is treated by making healthy lifestyle changes, such as eating healthy foods, exercising  more, and reducing your alcohol intake. Your health care provider may prescribe medicine if lifestyle changes are not enough to get your blood pressure under control, and if:  Your systolic blood pressure is above 130.  Your diastolic blood pressure is above 80.  Your personal target blood pressure may vary depending on your medical conditions, your age, and other factors. Follow these instructions at home: Eating and drinking  Eat a diet that is high in fiber and potassium, and low in sodium, added sugar, and fat. An example eating plan is called the DASH (Dietary Approaches to Stop Hypertension) diet. To eat this way: ? Eat plenty of fresh fruits and vegetables. Try to fill half of your plate at each meal with fruits and vegetables. ? Eat whole grains, such as whole wheat pasta, brown rice, or whole grain bread. Fill about one quarter of your plate with whole grains. ? Eat or drink low-fat dairy products, such as skim milk or low-fat yogurt. ? Avoid fatty cuts of meat, processed or cured meats, and poultry with skin. Fill about one quarter of your plate with lean proteins, such as fish, chicken without skin, beans, eggs, and tofu. ? Avoid premade and processed foods. These tend to be higher in sodium, added sugar, and fat.  Reduce your daily sodium intake. Most people with hypertension should eat less than 1,500 mg of sodium a day.  Limit alcohol intake to no more than 1 drink a day for nonpregnant women and 2 drinks a day for men. One drink equals 12 oz of beer, 5 oz of wine, or 1 oz of hard liquor. Lifestyle  Work with your health care provider to maintain a healthy body weight or to lose weight. Ask what an ideal weight is for you.  Get at least 30 minutes of exercise that causes your heart to beat faster (aerobic exercise) most days of the week. Activities may include walking, swimming, or biking.  Include exercise to strengthen your muscles (resistance exercise), such as pilates or  lifting weights, as part of your weekly exercise routine. Try to do these types of exercises for 30 minutes at least 3 days a week.  Do not use any products that contain nicotine or tobacco, such as cigarettes and e-cigarettes. If you need help quitting, ask your health care provider.  Monitor your blood pressure at home as told by your health care provider.  Keep all follow-up visits as told by your health care provider. This is important. Medicines  Take over-the-counter and prescription medicines only as told by your health care provider. Follow directions carefully. Blood pressure medicines must be taken as prescribed.  Do not skip doses of blood pressure medicine. Doing this puts you at risk for problems and can make the medicine less effective.  Ask your health care provider about side effects or reactions to medicines that you should watch for. Contact a health care provider if:  You think you are having a reaction to a medicine you are taking.  You have headaches that keep coming back (recurring).  You feel dizzy.  You have swelling in your ankles.  You have trouble with your vision. Get help right away if:  You develop a severe headache or confusion.  You have unusual weakness or numbness.  You feel faint.  You have severe pain in your chest or abdomen.  You vomit repeatedly.  You have trouble breathing. Summary  Hypertension is when the force of blood pumping through your arteries is too strong. If this condition is not controlled, it may put you at risk for serious complications.  Your personal target blood pressure may vary depending on your medical conditions, your age, and other factors. For most people, a normal blood pressure is less than 120/80.  Hypertension is treated with lifestyle changes, medicines, or a combination of both. Lifestyle changes include weight loss, eating a healthy, low-sodium diet, exercising more, and limiting alcohol. This  information is not intended to replace advice given to you by your health care provider. Make sure you discuss any questions you have with your health care provider. Document Released: 03/14/2005 Document Revised: 02/10/2016 Document Reviewed: 02/10/2016 Elsevier Interactive Patient Education  2018 [employer].   How to Take Your Blood Pressure You can take your blood pressure at home with a machine. You may need to check your blood pressure at home:  To check if you have high blood pressure (hypertension).  To check your blood pressure over time.  To make sure your blood pressure medicine is working.  Supplies needed: You will need a blood pressure machine, or monitor. You can buy one at a drugstore or online. When choosing one:  Choose one with an arm cuff.  Choose one that wraps around your upper arm. Only one finger should fit between your arm and the cuff.  Do not choose one that measures your blood pressure from your wrist or finger.  Your doctor can suggest a monitor. How to prepare Avoid these things for 30 minutes before checking your blood pressure:  Drinking caffeine.  Drinking alcohol.  Eating.  Smoking.  Exercising.  Five minutes before checking your blood pressure:  Pee.  Sit in a dining chair. Avoid sitting in a soft couch or armchair.  Be quiet. Do not talk.  How to take your blood pressure Follow the instructions that came with your machine. If you have a digital blood pressure monitor, these may be the instructions: 1. Sit up straight. 2. Place your feet on the floor. Do not cross your ankles or legs. 3. Rest your left arm at the level of your heart. You may rest it on a table, desk, or chair. 4. Pull up your shirt sleeve. 5. Wrap the blood pressure cuff around the upper part of your left arm. The cuff should be 1 inch (2.5 cm) above your elbow. It is best to wrap the cuff around bare skin. 6. Fit the cuff snugly around your arm. You should be  able to place only one finger between the cuff and your arm. 7. Put the cord inside the groove of your elbow. 8. Press the power button. 9. Sit quietly while the cuff fills with air and loses air. 10. Write down the numbers on the screen. 11. Wait 2-3 minutes and then repeat steps 1-10.  What do the numbers mean? Two numbers make up your blood pressure. The first number is called systolic pressure. The second is called diastolic pressure. An example of a blood pressure reading is "120 over 80" (or 120/80). If you are an adult and do not  have a medical condition, use this guide to find out if your blood pressure is normal: Normal  First number: below 120.  Second number: below 80. Elevated  First number: 120-129.  Second number: below 80. Hypertension stage 1  First number: 130-139.  Second number: 80-89. Hypertension stage 2  First number: 140 or above.  Second number: 53 or above. Your blood pressure is above normal even if only the top or bottom number is above normal. Follow these instructions at home:  Check your blood pressure as often as your doctor tells you to.  Take your monitor to your next doctor's appointment. Your doctor will: ? Make sure you are using it correctly. ? Make sure it is working right.  Make sure you understand what your blood pressure numbers should be.  Tell your doctor if your medicines are causing side effects. Contact a doctor if:  Your blood pressure keeps being high. Get help right away if:  Your first blood pressure number is higher than 180.  Your second blood pressure number is higher than 120. This information is not intended to replace advice given to you by your health care provider. Make sure you discuss any questions you have with your health care provider. Document Released: 02/25/2008 Document Revised: 02/10/2016 Document Reviewed: 08/21/2015 Elsevier Interactive Patient Education  [employer].    If you have  lab work done today you will be contacted with your lab results within the next 2 weeks.  If you have not heard from [place] then please contact us. The fastest way to get your results is to register for My Chart.   IF you received an x-ray today, you will receive an invoice from [facility]. Please contact [facility] at [phone] with questions or concerns regarding your invoice.   IF you received labwork today, you will receive an invoice from [facility]. Please contact [facility] at [phone] with questions or concerns regarding your invoice.   Our billing staff will not be able to assist you with questions regarding bills from these companies.  You will be contacted with the lab results as soon as they are available. The fastest way to get your results is to activate your My Chart account. Instructions are located on the last page of this paperwork. If you have not heard from [place] regarding the results in 2 weeks, please contact this office.

## 2017-11-25 ENCOUNTER — Encounter: Payer: Self-pay | Admitting: Family Medicine

## 2017-12-21 ENCOUNTER — Telehealth: Payer: Self-pay | Admitting: Family Medicine

## 2017-12-21 DIAGNOSIS — R400 Somnolence: Secondary | ICD-10-CM

## 2017-12-21 NOTE — Telephone Encounter (Signed)
Copied from [place] [id number]. Topic: General - Other >> Dec 21, 2017  2:31 PM Leward Quan A wrote:  Reason for CRM: Patient called to say that he received an order about a year ago for a sleep study but did not complete one because he felt better. He is now requesting orders sent to [facility] for a sleep study because symptoms have arise again. Also patient is requesting a call back when this is done.

## 2018-01-02 ENCOUNTER — Encounter: Payer: Self-pay | Admitting: Neurology

## 2018-01-02 ENCOUNTER — Ambulatory Visit (INDEPENDENT_AMBULATORY_CARE_PROVIDER_SITE_OTHER): Payer: Managed Care, Other (non HMO) | Admitting: Neurology

## 2018-01-02 VITALS — BP 126/86 | HR 52 | Ht 70.0 in | Wt 206.0 lb

## 2018-01-02 DIAGNOSIS — R0683 Snoring: Secondary | ICD-10-CM | POA: Diagnosis not present

## 2018-01-02 DIAGNOSIS — Z87898 Personal history of other specified conditions: Secondary | ICD-10-CM | POA: Diagnosis not present

## 2018-01-02 NOTE — Progress Notes (Signed)
SLEEP MEDICINE CLINIC   Provider:  Larey Seat, M D  Primary Care Physician:  [name], MD   Referring Provider: [name], MD    Chief Complaint  Patient presents with  . New Patient (Initial Visit)    pt alone, rm 10. pt states that per his wife over the last year he has started snoring more often. his PCP noticed his BP has spiked. there has been intermittent daytime sleepiness.     HPI:  Patrick Willis is a 57 y.o. male patient is seen here on 01-02-2018  in a referral from Dr. Carlota Raspberry for a sleep evaluation.  Chief complaint according to patient : The patient had been scheduled for a consult 1 year ago, but lost weight, stopped snoring, felt less fatigue and therefor cancelled the work up.  In the meantime he has gained again ( about 10 pounds ) , and his last visit with PCP had elevated BP,  HTN. Also,  his wife stated he snores more, not having observed apneas.  Sleep habits are as follows: Dinnertime is between 6-7 pm, bedtime 10.30 pm, and promptly asleep.  He does not fall asleep on the sofa or in an armchair.  The patient's bedroom is described as cool, quiet and dark and conducive to sleep.  He usually sleeps on only one pillow for head and neck support, there is another pillow that helps him with body positioning at night.  He prefers to sleep on his side. Has 2 nocturias, and goes back to sleep each time.  He wakes up spontaneously in the mornings, does not need to rely on an alarm for his routine day.  This is around 5:20 AM.  If his last bathroom break would be very close to that normal rise time he may find it difficult to go back to sleep in the interval.  When he wakes up he is usually without headaches, no dizziness no palpitations no diaphoresis.  He is usually well rested when he wakes up.  Sleep medical history and family sleep history: father had OSA on CPAP.  Brother snores, not sure if he is on CPAP. No ENT surgery histoyr, TBI, no asthma, not  frequent bronchitis or sinusitis.  No nasal deviation.   Social history: non smoker, alcohol use on weekends- 1-2 beers.  caffeine - 2 cups of coffee in AM on commute , and 1-2 more at work. No sodas, no iced tea.   Review of Systems: Out of a complete 14 system review, the patient complains of only the following symptoms, and all other reviewed systems are negative.  snoring.   Epworth score 7/ 24  , Fatigue severity score 21/ 63   , depression score n/a    Family History  Problem Relation Age of Onset  . Cancer Mother   . Cancer Father   . Hypertension Father   . Sleep apnea Father   . Cancer Sister   . Cancer Maternal Grandmother   . Cancer Maternal Grandfather   . Stroke Paternal Grandfather   . Diabetes Paternal Grandfather      Past Surgical History:  Procedure Laterality Date  . HERNIA REPAIR     Ventral and inguinal hernia repair, mash implanted.    Allergies as of 01/02/2018  . (No Known Allergies)    Vitals: BP 126/86   Pulse (!) 52   Ht 5\' 10"  (1.778 m)   Wt 206 lb (93.4 kg)   BMI 29.56 kg/m  Last  Weight:  Wt Readings from Last 1 Encounters:  01/02/18 206 lb (93.4 kg)   KXF:GHWE mass index is 29.56 kg/m.      Last Height:   Ht Readings from Last 1 Encounters:  01/02/18 5\' 10"  (1.778 m)    Physical exam:  General: The patient is awake, alert and appears not in acute distress. The patient is well groomed. Head: Normocephalic, atraumatic. Neck is supple. Mallampati 3-4 , swollen uvula, left partly. ,  neck circumference:16. 75- . Nasal airflow patent ,  Retrognathia is seen. Wore braces for over bite. Has a bridge.  Cardiovascular:  Regular rate and rhythm , without  murmurs or carotid bruit, and without distended neck veins. Respiratory: Lungs are clear to auscultation. Skin:  Without evidence of edema, or rash Trunk: BMI is 29.6 kg/m2 . The patient's posture is erect.  Neurologic exam : The patient is awake and alert, oriented to place and  time.   Memory subjective described as intact.  Attention span & concentration ability appears normal.  Speech is fluent,  without dysarthria, dysphonia or aphasia.  Mood and affect are appropriate.  Cranial nerves: Pupils are equal and briskly reactive to light.  Extraocular movements  in vertical and horizontal planes intact and without nystagmus. Visual fields by finger perimetry are intact. Hearing to finger rub intact, but speaks loudler.  Facial sensation intact to fine touch. Facial motor strength is symmetric and tongue and uvula move midline. Shoulder shrug was symmetrical.  Motor exam:  Normal tone, muscle bulk and symmetric strength in all extremities. Sensory:  Fine touch, pinprick and vibration were tested in all extremities. Proprioception tested in the upper extremities was normal. Coordination:  Finger-to-nose maneuver normal without evidence of ataxia, dysmetria or tremor. No change in coordination or penmanship.  Gait and station: Patient walks without assistive device and is able unassisted to climb up to the exam table.  Deep tendon reflexes: in the  upper and lower extremities are symmetric and intact.    Assessment: After physical and neurologic examination, review of laboratory studies,  Personal review of pre-existing records as far as provided in visit., my assessment is :  1) Low risk category for OSA, there is snoring - even while sleeping on his side- retrognathia and he is overweight without morbid obesity being present.   2) HST to screen for apnea, would prefer a watch pat.   3) he is able to correlate his snoring- volume to weight gain. May benefit from a lower carb diet    The patient was advised of the nature of the diagnosed disorder , the treatment options and the  risks for general health and wellness arising from not treating the condition.   I spent more than 30 minutes of face to face time with the patient.  Greater than 50% of time was spent in  counseling and coordination of care. We have discussed the diagnosis and differential and I answered the patient's questions.    Plan:  Treatment plan and additional workup : HST   Larey Seat, MD 99/05/7167, 67:89 AM  Certified in Neurology by ABPN Certified in [place] by [facility] [address], [place], [place] [address]

## 2018-01-28 ENCOUNTER — Encounter: Payer: Self-pay | Admitting: Family Medicine

## 2018-01-29 ENCOUNTER — Ambulatory Visit (INDEPENDENT_AMBULATORY_CARE_PROVIDER_SITE_OTHER): Payer: Managed Care, Other (non HMO) | Admitting: Neurology

## 2018-01-29 DIAGNOSIS — R0683 Snoring: Secondary | ICD-10-CM

## 2018-01-29 DIAGNOSIS — G4733 Obstructive sleep apnea (adult) (pediatric): Secondary | ICD-10-CM | POA: Diagnosis not present

## 2018-01-29 DIAGNOSIS — Z87898 Personal history of other specified conditions: Secondary | ICD-10-CM

## 2018-02-05 NOTE — Procedures (Signed)
NAME:   Patrick Willis                                                               DOB: Apr 04, 1960 MEDICAL RECORD NUMBER [id number]                                             DOS:  01/29/2018 REFERRING PHYSICIAN: Merri Ray, MD STUDY PERFORMED: Home Sleep Study on watch pat HISTORY: Patrick Willis is a 57 y.o. male patient was seen on 01-02-2018 in a referral from Dr. Carlota Raspberry for a sleep evaluation.   Chief complaint according to patient: The patient had been scheduled for a consult 1 year ago, but lost weight, stopped snoring, felt less fatigued and therefor cancelled the work up.  In the meantime he has gained again (about 10 pounds), and his last visit with PCP had recorded elevated BP. His wife stated he snores more, again, but not having observed apneas. He is usually well rested when he wakes up.   Epworth Sleepiness score endorsed at 7/ 24 points, Fatigue severity score at 21/ 63 points.         The BMI was 29.5 kg/m2   STUDY RESULTS:  Total Recording Time: 7 hours 50 minutes, valid test time 6 h and 7 min.  Total Apnea/Hypopnea Index (AHI): 22.4 /h; RDI: 26.3 /h; ODI 5.5 /h.  Average Oxygen Saturation: 94 %; Lowest Oxygen Desaturation: 90 %  Total Time in Oxygen Saturation below 89 %: 0.0 minutes  Average Heart Rate: 58 bpm (between 48 and 99 bpm) IMPRESSION: Moderate -severe sleep apnea with loud snoring, but without REM sleep accentuation, without prolonged hypoxemia or tachy-bradycardic responses. RECOMMENDATION: This type of apnea is treatable by CPAP, by dental device and may respond to further weight reduction.  I will order autotitration CPAP between 5-15 cm water, if patient agrees. Please send CPAP order after patient feedback.  I certify that I have reviewed the raw data recording prior to the issuance of this report in accordance with the standards of the American Academy of Sleep Medicine (AASM). Larey Seat, M.D.   02-05-2018    Medical Director of [facility] at [facility],  accredited by the AASM. Diplomat of the ABPN and ABSM.

## 2018-02-05 NOTE — Addendum Note (Signed)
Addended by: Larey Seat on: 02/05/2018 06:22 PM   Modules accepted: Orders

## 2018-02-06 ENCOUNTER — Telehealth: Payer: Self-pay | Admitting: Neurology

## 2018-02-06 DIAGNOSIS — R0683 Snoring: Secondary | ICD-10-CM

## 2018-02-06 DIAGNOSIS — Z87898 Personal history of other specified conditions: Secondary | ICD-10-CM

## 2018-02-06 DIAGNOSIS — G4733 Obstructive sleep apnea (adult) (pediatric): Secondary | ICD-10-CM

## 2018-02-06 NOTE — Telephone Encounter (Signed)
Called patient to discuss sleep study results. No answer at this time. LVM for the patient to call back.   

## 2018-02-06 NOTE — Telephone Encounter (Signed)
Called the patient and advised of the sleep study results. Went over the study in detail informing the apnea was considered moderate to severe around 22.4 times an hr he would have apnea or shallow breath. Informed him that oxygen and heart rate stayed in good range.  Went over CPAP and dental device and what both would look like. Advised that Dr Brett Fairy recommends CPAP as initial treatment but offered the dental device as another option. Patient decided to attempt the dental device route first. Informed him we will place a referral to dentist for this and that someone from that dentist should be in contact with him to get him scheduled. Pt verbalized understanding. Pt had no questions at this time but was encouraged to call back if questions arise.

## 2018-02-06 NOTE — Addendum Note (Signed)
Addended by: Darleen Crocker on: 02/06/2018 10:28 AM   Modules accepted: Orders

## 2018-02-06 NOTE — Telephone Encounter (Signed)
-----   Message from Larey Seat, MD sent at 02/05/2018  6:22 PM EST ----- IMPRESSION: Moderate -severe sleep apnea with loud snoring, but  without REM sleep accentuation, without prolonged hypoxemia or  tachy-bradycardic responses. RECOMMENDATION: This type of apnea is treatable by CPAP, by dental device and may respond to further weight reduction. PS :  I would like to order autotitration CPAP between 5-15 cm water, if patient agrees. Please send CPAP order only after patient's feedback. Larey Seat, MD

## 2018-02-14 ENCOUNTER — Ambulatory Visit (INDEPENDENT_AMBULATORY_CARE_PROVIDER_SITE_OTHER): Payer: Managed Care, Other (non HMO) | Admitting: Family Medicine

## 2018-02-14 ENCOUNTER — Other Ambulatory Visit: Payer: Self-pay

## 2018-02-14 ENCOUNTER — Encounter: Payer: Self-pay | Admitting: Family Medicine

## 2018-02-14 VITALS — BP 122/84 | HR 52 | Temp 97.8°F | Ht 70.0 in | Wt 206.8 lb

## 2018-02-14 DIAGNOSIS — E785 Hyperlipidemia, unspecified: Secondary | ICD-10-CM | POA: Diagnosis not present

## 2018-02-14 DIAGNOSIS — G4733 Obstructive sleep apnea (adult) (pediatric): Secondary | ICD-10-CM

## 2018-02-14 DIAGNOSIS — R7303 Prediabetes: Secondary | ICD-10-CM | POA: Diagnosis not present

## 2018-02-14 NOTE — Patient Instructions (Addendum)
Dental device may be an option for your type of sleep apnea, or CPAP. Whichever option you choose, I would recommend follow up with sleep specialist for further testing on that intervention.   Blood pressure borderline but ok today. Treating sleep apnea should apnea should help as well.   As you are more rested with OSA treatment, I expect afternoon energy levels to improve.  Low intensity exercise also recommended most days per week.   Plan on repeat blood work in 3 months. Thanks for coming in today.    If you have lab work done today you will be contacted with your lab results within the next 2 weeks.  If you have not heard from [place] then please contact us. The fastest way to get your results is to register for My Chart.   IF you received an x-ray today, you will receive an invoice from [facility]. Please contact [facility] at [phone] with questions or concerns regarding your invoice.   IF you received labwork today, you will receive an invoice from [place]. Please contact [facility] at [phone] with questions or concerns regarding your invoice.   Our billing staff will not be able to assist you with questions regarding bills from these companies.  You will be contacted with the lab results as soon as they are available. The fastest way to get your results is to activate your My Chart account. Instructions are located on the last page of this paperwork. If you have not heard from [place] regarding the results in 2 weeks, please contact this office.

## 2018-02-14 NOTE — Progress Notes (Signed)
Subjective:  By signing my name below, I, [name], attest that this documentation has been prepared under the direction and in the presence of Merri Ray, MD. Electronically Signed: [name], [place]. 02/14/2018 , 10:48 AM .  Patient was seen in Room 8 .   Patient ID: Patrick Willis, male    DOB: Apr 16, 1960, 57 y.o.   MRN: [id number] Chief Complaint  Patient presents with  . Hypertension    3 m f/u   . Diabetes   HPI Patrick Willis is a 57 y.o. male Here for follow up. He is not fasting today.   Sleep study Patient was evaluated by Dr. Brett Fairy at [facility] neuro initial visit on Oct 8th, with home testing procedure sleep study done on Nov 4th. AHI 22.4 with oxygen nadir of 90.   Patient reports he had home sleep study done, with diagnosed having mild sleep apnea. He was given option of dental or CPAP machine. He's going to try dental route, and will go in later today to discuss this. He mentions his father used CPAP machine.   Elevated BP BP Readings from Last 3 Encounters:  02/14/18 122/84  01/02/18 126/86  11/21/17 138/88   Lab Results  Component Value Date   CREATININE 0.82 10/24/2017   His BP had improved at last visit from August visit. Recommended low intensity exercise. Home BP readings were around 120s/80s.   Pre-diabetes/hyperglycemia Lab Results  Component Value Date   HGBA1C 5.8 (H) 10/24/2017   Wt Readings from Last 3 Encounters:  02/14/18 206 lb 12.8 oz (93.8 kg)  01/02/18 206 lb (93.4 kg)  11/21/17 205 lb 12.8 oz (93.4 kg)   He's currently not on medications. He was losing weight for some time, but then started to put weight back on. He's been consistently walking. He denies any increased urinary frequency, or increased thirst.   Hyperlipidemia Lab Results  Component Value Date   CHOL 194 10/24/2017   HDL 65 10/24/2017   LDLCALC 114 (H) 10/24/2017   TRIG 76 10/24/2017   CHOLHDL 3.0 10/24/2017   Lab Results  Component Value Date   ALT 25 10/24/2017   AST 21 10/24/2017   ALKPHOS 55 10/24/2017   BILITOT 0.5 10/24/2017   Discussed diet and exercise. His ASCVD risk in July was 5.8%; held on statins.   There are no active problems to display for this patient.  No past medical history on file. Past Surgical History:  Procedure Laterality Date  . HERNIA REPAIR     No Known Allergies Prior to Admission medications   Medication Sig Start Date End Date Taking? Authorizing Provider  Multiple Vitamins-Minerals (MULTIVITAMIN WITH MINERALS) tablet Take 1 tablet by mouth daily.   Yes [provider]   Social History   Socioeconomic History  . Marital status: Married    Spouse name: Not on file  . Number of children: Not on file  . Years of education: Not on file  . Highest education level: Not on file  Occupational History  . Occupation: Biochemist, clinical  Social Needs  . Financial resource strain: Not on file  . Food insecurity:    Worry: Not on file    Inability: Not on file  . Transportation needs:    Medical: Not on file    Non-medical: Not on file  Tobacco Use  . Smoking status: Never Smoker  . Smokeless tobacco: Never Used  Substance and Sexual Activity  . Alcohol use: Yes    Alcohol/week: 4.0 standard  drinks    Types: 4 Cans of beer per week  . Drug use: No  . Sexual activity: Not on file  Lifestyle  . Physical activity:    Days per week: Not on file    Minutes per session: Not on file  . Stress: Not on file  Relationships  . Social connections:    Talks on phone: Not on file    Gets together: Not on file    Attends religious service: Not on file    Active member of club or organization: Not on file    Attends meetings of clubs or organizations: Not on file    Relationship status: Not on file  . Intimate partner violence:    Fear of current or ex partner: Not on file    Emotionally abused: Not on file    Physically abused: Not on file    Forced sexual activity: Not on file  Other Topics  Concern  . Not on file  Social History Narrative   Exercise walking x 2 miles four times/wk   Review of Systems  Constitutional: Negative for fatigue and unexpected weight change.  Eyes: Negative for visual disturbance.  Respiratory: Negative for cough, chest tightness and shortness of breath.   Cardiovascular: Negative for chest pain, palpitations and leg swelling.  Gastrointestinal: Negative for abdominal pain and blood in stool.  Neurological: Negative for dizziness, light-headedness and headaches.       Objective:   Physical Exam  Constitutional: He is oriented to person, place, and time. He appears well-developed and well-nourished. No distress.  HENT:  Head: Normocephalic and atraumatic.  Eyes: Pupils are equal, round, and reactive to light. EOM are normal.  Neck: Neck supple.  Cardiovascular: Normal rate.  Pulmonary/Chest: Effort normal. No respiratory distress.  Musculoskeletal: Normal range of motion.  Neurological: He is alert and oriented to person, place, and time.  Skin: Skin is warm and dry.  Psychiatric: He has a normal mood and affect. His behavior is normal.  Nursing note and vitals reviewed.   Vitals:   02/14/18 0953 02/14/18 0954  BP: (!) 148/96 122/84  Pulse: (!) 52   Temp: 97.8 F (36.6 C)   TempSrc: Oral   SpO2: 98%   Weight: 206 lb 12.8 oz (93.8 kg)   Height: 5\' 10"  (1.778 m)        Assessment & Plan:  .Patrick Willis is a 57 y.o. male OSA (obstructive sleep apnea)  -Results noted and reviewed with patient.  No significant hypoxemia but moderate to severe based on AHI level.  Suspect some of his daytime fatigue is related to OSA, and could impact blood pressure as well.  Recommend either dental device or CPAP with repeat testing on treatment.  Prediabetes  -Weight same as last visit.  Plan on increased exercise, diet changes, suspect this will be easier once his CPAP is treated.  Recheck in 3 months with fasting labs.   Hyperlipidemia,  unspecified hyperlipidemia type  -Diet/exercise as above.  Plan of fasting labs next visit as no significant changes since last time.  No orders of the defined types were placed in this encounter.  Patient Instructions   Dental device may be an option for your type of sleep apnea, or CPAP. Whichever option you choose, I would recommend follow up with sleep specialist for further testing on that intervention.   Blood pressure borderline but ok today. Treating sleep apnea should apnea should help as well.   As you are more rested  with OSA treatment, I expect afternoon energy levels to improve.  Low intensity exercise also recommended most days per week.   Plan on repeat blood work in 3 months. Thanks for coming in today.    If you have lab work done today you will be contacted with your lab results within the next 2 weeks.  If you have not heard from [place] then please contact us. The fastest way to get your results is to register for My Chart.   IF you received an x-ray today, you will receive an invoice from [facility]. Please contact [facility] at [phone] with questions or concerns regarding your invoice.   IF you received labwork today, you will receive an invoice from [place]. Please contact [facility] at [phone] with questions or concerns regarding your invoice.   Our billing staff will not be able to assist you with questions regarding bills from these companies.  You will be contacted with the lab results as soon as they are available. The fastest way to get your results is to activate your My Chart account. Instructions are located on the last page of this paperwork. If you have not heard from [place] regarding the results in 2 weeks, please contact this office.       I personally performed the services described in this documentation, which was scribed in my presence. The recorded information has been reviewed and considered for accuracy and completeness,  addended by me as needed, and agree with information above.  Signed,   Merri Ray, MD [facility].  02/14/18 12:42 PM

## 2018-02-15 NOTE — Telephone Encounter (Signed)
Received a notification from Dr Ron Parker office stating the patient is wanting to start the device but pending insurance at this time.

## 2018-03-16 ENCOUNTER — Encounter: Payer: Self-pay | Admitting: Neurology

## 2018-04-17 ENCOUNTER — Telehealth: Payer: Self-pay | Admitting: Neurology

## 2018-04-17 NOTE — Telephone Encounter (Signed)
Pt states his O2 did not drop during the study and he only has periods of light breathing. Pt would like a call back to discuss the necessity of him starting cpap. Pt thinks he does not need cpap.

## 2018-04-17 NOTE — Telephone Encounter (Signed)
Called the patient back. Patient wanted to understand his sleep study results better. Advised him of the findings and the importance of the apnea events at 22 times an hour still are present even though the oxygen level and heart rate did well. Advised the patient that the recommendation would be auto CPAP to help treat the apnea events that are present and hopefully help him get a better night sleep as well as hopefully help with daytime sleepiness. Patient is asking that I send the cpap information over to [facility] as he heard they are in net work and he would like to compare prices. Advised the patient I would send this information for him. Pt verbalized understanding. Advised the patient of cpap compliance and making sure that once he is established he sets up a apt 31-90 days after starting the CPAP. Pt verbalized understanding. Pt had no questions at this time but was encouraged to call back if questions arise.

## 2018-04-25 ENCOUNTER — Encounter: Payer: Self-pay | Admitting: Neurology

## 2018-05-02 ENCOUNTER — Telehealth: Payer: Self-pay | Admitting: Neurology

## 2018-05-02 ENCOUNTER — Other Ambulatory Visit: Payer: Self-pay | Admitting: Neurology

## 2018-05-02 DIAGNOSIS — G4733 Obstructive sleep apnea (adult) (pediatric): Secondary | ICD-10-CM

## 2018-05-02 NOTE — Telephone Encounter (Signed)
Received a fax that a referral was needed for the patient to get a dental device made and the pt was going to Dr [facility] office. I have placed the referral and sent over everything they listed they needed for the patient.

## 2018-05-17 ENCOUNTER — Ambulatory Visit: Payer: Managed Care, Other (non HMO) | Admitting: Family Medicine

## 2018-11-17 ENCOUNTER — Encounter: Payer: Self-pay | Admitting: Family Medicine

## 2018-11-19 ENCOUNTER — Other Ambulatory Visit: Payer: Self-pay | Admitting: Emergency Medicine

## 2018-11-19 DIAGNOSIS — R7303 Prediabetes: Secondary | ICD-10-CM

## 2018-11-19 DIAGNOSIS — Z0000 Encounter for general adult medical examination without abnormal findings: Secondary | ICD-10-CM

## 2018-11-19 DIAGNOSIS — E785 Hyperlipidemia, unspecified: Secondary | ICD-10-CM

## 2018-11-19 DIAGNOSIS — Z125 Encounter for screening for malignant neoplasm of prostate: Secondary | ICD-10-CM

## 2018-11-19 DIAGNOSIS — I10 Essential (primary) hypertension: Secondary | ICD-10-CM

## 2018-11-20 ENCOUNTER — Other Ambulatory Visit: Payer: Self-pay

## 2018-11-20 ENCOUNTER — Ambulatory Visit (INDEPENDENT_AMBULATORY_CARE_PROVIDER_SITE_OTHER): Payer: Managed Care, Other (non HMO) | Admitting: Family Medicine

## 2018-11-20 DIAGNOSIS — Z125 Encounter for screening for malignant neoplasm of prostate: Secondary | ICD-10-CM | POA: Diagnosis not present

## 2018-11-20 DIAGNOSIS — E785 Hyperlipidemia, unspecified: Secondary | ICD-10-CM

## 2018-11-20 DIAGNOSIS — I10 Essential (primary) hypertension: Secondary | ICD-10-CM

## 2018-11-20 DIAGNOSIS — R7303 Prediabetes: Secondary | ICD-10-CM

## 2018-11-20 NOTE — Progress Notes (Signed)
Lab visit only. 

## 2018-11-21 ENCOUNTER — Ambulatory Visit (INDEPENDENT_AMBULATORY_CARE_PROVIDER_SITE_OTHER): Payer: Managed Care, Other (non HMO) | Admitting: Family Medicine

## 2018-11-21 ENCOUNTER — Encounter: Payer: Self-pay | Admitting: Family Medicine

## 2018-11-21 VITALS — BP 133/86 | HR 56 | Temp 98.0°F | Resp 14 | Wt 205.6 lb

## 2018-11-21 DIAGNOSIS — G4733 Obstructive sleep apnea (adult) (pediatric): Secondary | ICD-10-CM

## 2018-11-21 DIAGNOSIS — R7303 Prediabetes: Secondary | ICD-10-CM

## 2018-11-21 DIAGNOSIS — Z23 Encounter for immunization: Secondary | ICD-10-CM

## 2018-11-21 DIAGNOSIS — K439 Ventral hernia without obstruction or gangrene: Secondary | ICD-10-CM

## 2018-11-21 DIAGNOSIS — Z0001 Encounter for general adult medical examination with abnormal findings: Secondary | ICD-10-CM | POA: Diagnosis not present

## 2018-11-21 DIAGNOSIS — Z0000 Encounter for general adult medical examination without abnormal findings: Secondary | ICD-10-CM

## 2018-11-21 DIAGNOSIS — R1031 Right lower quadrant pain: Secondary | ICD-10-CM | POA: Diagnosis not present

## 2018-11-21 LAB — HEMOGLOBIN A1C
Est. average glucose Bld gHb Est-mCnc: 117 mg/dL
Hgb A1c MFr Bld: 5.7 % — ABNORMAL HIGH (ref 4.8–5.6)

## 2018-11-21 LAB — COMPREHENSIVE METABOLIC PANEL
ALT: 12 IU/L (ref 0–44)
AST: 15 IU/L (ref 0–40)
Albumin/Globulin Ratio: 2.1 (ref 1.2–2.2)
Albumin: 4.6 g/dL (ref 3.8–4.9)
Alkaline Phosphatase: 56 IU/L (ref 39–117)
BUN/Creatinine Ratio: 20 (ref 9–20)
BUN: 17 mg/dL (ref 6–24)
Bilirubin Total: 0.5 mg/dL (ref 0.0–1.2)
CO2: 22 mmol/L (ref 20–29)
Calcium: 9.6 mg/dL (ref 8.7–10.2)
Chloride: 104 mmol/L (ref 96–106)
Creatinine, Ser: 0.84 mg/dL (ref 0.76–1.27)
GFR calc Af Amer: 112 mL/min/{1.73_m2} (ref >59)
GFR calc non Af Amer: 97 mL/min/{1.73_m2} (ref >59)
Globulin, Total: 2.2 g/dL (ref 1.5–4.5)
Glucose: 112 mg/dL — ABNORMAL HIGH (ref 65–99)
Potassium: 4.5 mmol/L (ref 3.5–5.2)
Sodium: 141 mmol/L (ref 134–144)
Total Protein: 6.8 g/dL (ref 6.0–8.5)

## 2018-11-21 LAB — LIPID PANEL
Chol/HDL Ratio: 3.2 ratio (ref 0.0–5.0)
Cholesterol, Total: 181 mg/dL (ref 100–199)
HDL: 56 mg/dL (ref >39)
LDL Calculated: 108 mg/dL — ABNORMAL HIGH (ref 0–99)
Triglycerides: 86 mg/dL (ref 0–149)
VLDL Cholesterol Cal: 17 mg/dL (ref 5–40)

## 2018-11-21 LAB — PSA: Prostate Specific Ag, Serum: 0.8 ng/mL (ref 0.0–4.0)

## 2018-11-21 NOTE — Patient Instructions (Addendum)
Call or email sleep specialist about need for repeat sleep test now that you are using the dental device.   I do recommend meeting with general surgery for ventral hernia and right groin pain in are of prior hernia. Let me know who you want to see and I will place that referral.    Keeping you healthy  Get these tests  Blood pressure- Have your blood pressure checked once a year by your healthcare provider.  Normal blood pressure is 120/80  Weight- Have your body mass index (BMI) calculated to screen for obesity.  BMI is a measure of body fat based on height and weight. You can also calculate your own BMI at ViewBanking.si.  Cholesterol- Have your cholesterol checked every year.  Diabetes- Have your blood sugar checked regularly if you have high blood pressure, high cholesterol, have a family history of diabetes or if you are overweight.  Screening for Colon Cancer- Colonoscopy starting at age 27.  Screening may begin sooner depending on your family history and other health conditions. Follow up colonoscopy as directed by your Gastroenterologist.  Screening for Prostate Cancer- Both blood work (PSA) and a rectal exam help screen for Prostate Cancer.  Screening begins at age 49 with African-American men and at age 37 with Caucasian men.  Screening may begin sooner depending on your family history.  Take these medicines  Aspirin- One aspirin daily can help prevent Heart disease and Stroke.  Flu shot- Every fall.  Tetanus- Every 10 years.  Zostavax- Once after the age of 18 to prevent Shingles.  Pneumonia shot- Once after the age of 21; if you are younger than 38, ask your healthcare provider if you need a Pneumonia shot.  Take these steps  Don't smoke- If you do smoke, talk to your doctor about quitting.  For tips on how to quit, go to www.smokefree.gov or call 1-800-QUIT-NOW.  Be physically active- Exercise 5 days a week for at least 30 minutes.  If you are not already  physically active start slow and gradually work up to 30 minutes of moderate physical activity.  Examples of moderate activity include walking briskly, mowing the yard, dancing, swimming, bicycling, etc.  Eat a healthy diet- Eat a variety of healthy food such as fruits, vegetables, low fat milk, low fat cheese, yogurt, lean meant, poultry, fish, beans, tofu, etc. For more information go to www.thenutritionsource.org  Drink alcohol in moderation- Limit alcohol intake to less than two drinks a day. Never drink and drive.  Dentist- Brush and floss twice daily; visit your dentist twice a year.  Depression- Your emotional health is as important as your physical health. If you're feeling down, or losing interest in things you would normally enjoy please talk to your healthcare provider.  Eye exam- Visit your eye doctor every year.  Safe sex- If you may be exposed to a sexually transmitted infection, use a condom.  Seat belts- Seat belts can save your life; always wear one.  Smoke/Carbon Monoxide detectors- These detectors need to be installed on the appropriate level of your home.  Replace batteries at least once a year.  Skin cancer- When out in the sun, cover up and use sunscreen 15 SPF or higher.  Violence- If anyone is threatening you, please tell your healthcare provider.  Living Will/ Health care power of attorney- Speak with your healthcare provider and family.    If you have lab work done today you will be contacted with your lab results within the next 2  weeks.  If you have not heard from [place] then please contact us. The fastest way to get your results is to register for My Chart.   IF you received an x-ray today, you will receive an invoice from [facility]. Please contact [facility] at [phone] with questions or concerns regarding your invoice.   IF you received labwork today, you will receive an invoice from [place]. Please contact [facility] at [phone]  with questions or concerns regarding your invoice.   Our billing staff will not be able to assist you with questions regarding bills from these companies.  You will be contacted with the lab results as soon as they are available. The fastest way to get your results is to activate your My Chart account. Instructions are located on the last page of this paperwork. If you have not heard from [place] regarding the results in 2 weeks, please contact this office.

## 2018-11-21 NOTE — Progress Notes (Signed)
Subjective:    Patient ID: Patrick Willis, male    DOB: 04-16-1960, 58 y.o.   MRN: [id number]  HPI Patrick Willis is a 58 y.o. male Presents today for: Chief Complaint  Patient presents with  . Annual Exam    hx of hernia repair. Just need area checked    Obstructive sleep apnea: Option of CPAP versus dental device/mouthguard.  AHI 22, oxygen nadir 90.  Evaluated by sleep specialist last year. Decided on mouth guard. Less snoring, no daytime sedation.  Prediabetes: Barely at level of prediabetes last year.  A1c 5.8 at that time. Wt Readings from Last 3 Encounters:  11/21/18 205 lb 9.6 oz (93.3 kg)  02/14/18 206 lb 12.8 oz (93.8 kg)  01/02/18 206 lb (93.4 kg)   Lab Results  Component Value Date   HGBA1C 5.7 (H) 11/20/2018  walking 10k steps per day.  No fast food 2 light beers per week. No soda/sweet tea.  Breakfast daily. Biggest meal - dinner.  Some weight gain with Covid pandemic - losing weight now.   Hyperlipidemia: Mild elevation, not on meds due to low overall ASCVD risk. Lab Results  Component Value Date   CHOL 181 11/20/2018   HDL 56 11/20/2018   LDLCALC 108 (H) 11/20/2018   TRIG 86 11/20/2018   CHOLHDL 3.2 11/20/2018   Lab Results  Component Value Date   ALT 12 11/20/2018   AST 15 11/20/2018   ALKPHOS 56 11/20/2018   BILITOT 0.5 11/20/2018  The 10-year ASCVD risk score Mikey Bussing DC Jr., et al., 2013) is: 5.9%   Values used to calculate the score:     Age: 47 years     Sex: Male     Is Non-Hispanic African American: No     Diabetic: No     Tobacco smoker: No     Systolic Blood Pressure: [id number] mmHg     Is BP treated: No     HDL Cholesterol: 56 mg/dL     Total Cholesterol: 181 mg/dL  Cancer screening: Colonoscopy 01/23/2012, repeat 10 years Prostate, recent PSA 0.8. agrees to DRE Derm: Dr. Renda Rolls once per year - last appt few months ago - ok.    History of R inguinal hernia repair in approx 2015, Dr. Zella Richer.  Past 6 months - soreness in R  groin - like a pulled muscle. No swelling, no redness.  No apparent correlation with straining. May notice day after increased athletic activity.   Ventral hernia: Repaired in [place], with repeat surgery few years later. Past year feels like more swelling in area.  No redness, no change in bowel habits.      Immunization History  Administered Date(s) Administered  . Influenza-Unspecified 01/28/2018  . Tdap 10/24/2017  . Zoster Recombinat (Shingrix) 10/24/2017, 01/28/2018  s/p 2 shingrix.  Flu today.    Depression screen [facility] 2/9 11/21/2018 02/14/2018 11/21/2017 10/24/2017 10/06/2016  Decreased Interest 0 0 0 0 0  Down, Depressed, Hopeless 0 0 0 0 0  PHQ - 2 Score 0 0 0 0 0    Hearing Screening   125Hz  250Hz  500Hz  1000Hz  2000Hz  3000Hz  4000Hz  6000Hz  8000Hz   Right ear:           Left ear:             Visual Acuity Screening   Right eye Left eye Both eyes  Without correction:     With correction: 20/20 20/15 20/15   last optho eval in past few months. Dr. Delman Cheadle.   Dental:  every 6 months, [employer].   Exercise: As above. Walking - greater than 19min per week.     There are no active problems to display for this patient.  No past medical history on file. Past Surgical History:  Procedure Laterality Date  . HERNIA REPAIR     No Known Allergies Prior to Admission medications   Medication Sig Start Date End Date Taking? Authorizing Provider  Multiple Vitamins-Minerals (MULTIVITAMIN WITH MINERALS) tablet Take 1 tablet by mouth daily.   Yes [provider]   Social History   Socioeconomic History  . Marital status: Married    Spouse name: Not on file  . Number of children: Not on file  . Years of education: Not on file  . Highest education level: Not on file  Occupational History  . Occupation: Biochemist, clinical  Social Needs  . Financial resource strain: Not on file  . Food insecurity    Worry: Not on file    Inability: Not on file  . Transportation  needs    Medical: Not on file    Non-medical: Not on file  Tobacco Use  . Smoking status: Never Smoker  . Smokeless tobacco: Never Used  Substance and Sexual Activity  . Alcohol use: Yes    Alcohol/week: 4.0 standard drinks    Types: 4 Cans of beer per week  . Drug use: No  . Sexual activity: Not on file  Lifestyle  . Physical activity    Days per week: Not on file    Minutes per session: Not on file  . Stress: Not on file  Relationships  . Social Herbalist on phone: Not on file    Gets together: Not on file    Attends religious service: Not on file    Active member of club or organization: Not on file    Attends meetings of clubs or organizations: Not on file    Relationship status: Not on file  . Intimate partner violence    Fear of current or ex partner: Not on file    Emotionally abused: Not on file    Physically abused: Not on file    Forced sexual activity: Not on file  Other Topics Concern  . Not on file  Social History Narrative   Exercise walking x 2 miles four times/wk    Review of Systems 13 point review of systems per patient health survey noted.  Negative other than as indicated above or in HPI.                                                                                                           Objective:   Physical Exam Vitals signs reviewed.  Constitutional:      Appearance: He is well-developed.  HENT:     Head: Normocephalic and atraumatic.     Right Ear: External ear normal.     Left Ear: External ear normal.  Eyes:     Conjunctiva/sclera: Conjunctivae normal.     Pupils: Pupils are equal, round,  and reactive to light.  Neck:     Musculoskeletal: Normal range of motion and neck supple.     Thyroid: No thyromegaly.  Cardiovascular:     Rate and Rhythm: Normal rate and regular rhythm.     Heart sounds: Normal heart sounds.  Pulmonary:     Effort: Pulmonary effort is normal. No respiratory distress.     Breath sounds: Normal  breath sounds. No wheezing.  Abdominal:     General: There is no distension.     Palpations: Abdomen is soft.     Tenderness: There is no abdominal tenderness.     Hernia: A hernia (Less than 2 to 3 cm firm area lateral/right side of ventral hernia scar.  Prominence of tissue with Valsalva/sitting up.  No erythema,, minimal discomfort.) is present. Hernia is present in the ventral area. There is no hernia in the left inguinal area or right inguinal area (No appreciable hernia identified on the right, but identifies location of discomfort at the inguinal canal, lateral aspect and posterior scrotum.  Testicle/epididymis nontender.).  Genitourinary:    Prostate: Normal.  Musculoskeletal: Normal range of motion.        General: No tenderness.  Lymphadenopathy:     Cervical: No cervical adenopathy.  Skin:    General: Skin is warm and dry.  Neurological:     Mental Status: He is alert and oriented to person, place, and time.     Deep Tendon Reflexes: Reflexes are normal and symmetric.  Psychiatric:        Behavior: Behavior normal.    Vitals:   11/21/18 1351  BP: 133/86  Pulse: (!) 56  Resp: 14  Temp: 98 F (36.7 C)  TempSrc: Oral  SpO2: 98%  Weight: 205 lb 9.6 oz (93.3 kg)        Assessment & Plan:  Patrick Willis is a 58 y.o. male Annual physical exam - Plan: Flu Vaccine QUAD 36+ mos IM  - -anticipatory guidance as below in AVS, screening labs above. Health maintenance items as above in HPI discussed/recommended as applicable.   OSA (obstructive sleep apnea)  -Recommended follow-up, will consult with sleep specialist to determine if testing needed with dental device to establish effectiveness/updated AHI.  Prediabetes  -Diet/exercise discussed.  Reports improving weight, continue to monitor.  Ventral hernia without obstruction or gangrene  -Possible ventral hernia versus diastasis recti.  Eval with general surgeon.  Right groin pain  -No appreciable hernia, but with prior  repair and new symptoms recommend follow-up with general surgery.  Needs flu shot - Plan: Flu Vaccine QUAD 36+ mos IM   No orders of the defined types were placed in this encounter.  Patient Instructions   Call or email sleep specialist about need for repeat sleep test now that you are using the dental device.   I do recommend meeting with general surgery for ventral hernia and right groin pain in are of prior hernia. Let me know who you want to see and I will place that referral.    Keeping you healthy  Get these tests  Blood pressure- Have your blood pressure checked once a year by your healthcare provider.  Normal blood pressure is 120/80  Weight- Have your body mass index (BMI) calculated to screen for obesity.  BMI is a measure of body fat based on height and weight. You can also calculate your own BMI at [email or web address].[email or web address].  Cholesterol- Have your cholesterol checked every year.  Diabetes- Have your blood sugar checked regularly  if you have high blood pressure, high cholesterol, have a family history of diabetes or if you are overweight.  Screening for Colon Cancer- Colonoscopy starting at age 49.  Screening may begin sooner depending on your family history and other health conditions. Follow up colonoscopy as directed by your Gastroenterologist.  Screening for Prostate Cancer- Both blood work (PSA) and a rectal exam help screen for Prostate Cancer.  Screening begins at age 35 with African-American men and at age 39 with Caucasian men.  Screening may begin sooner depending on your family history.  Take these medicines  Aspirin- One aspirin daily can help prevent Heart disease and Stroke.  Flu shot- Every fall.  Tetanus- Every 10 years.  Zostavax- Once after the age of 57 to prevent Shingles.  Pneumonia shot- Once after the age of 72; if you are younger than 73, ask your healthcare provider if you need a Pneumonia shot.  Take these steps  Don't smoke- If you do  smoke, talk to your doctor about quitting.  For tips on how to quit, go to www.smokefree.gov or call 1-800-QUIT-NOW.  Be physically active- Exercise 5 days a week for at least 30 minutes.  If you are not already physically active start slow and gradually work up to 30 minutes of moderate physical activity.  Examples of moderate activity include walking briskly, mowing the yard, dancing, swimming, bicycling, etc.  Eat a healthy diet- Eat a variety of healthy food such as fruits, vegetables, low fat milk, low fat cheese, yogurt, lean meant, poultry, fish, beans, tofu, etc. For more information go to www.thenutritionsource.org  Drink alcohol in moderation- Limit alcohol intake to less than two drinks a day. Never drink and drive.  Dentist- Brush and floss twice daily; visit your dentist twice a year.  Depression- Your emotional health is as important as your physical health. If you're feeling down, or losing interest in things you would normally enjoy please talk to your healthcare provider.  Eye exam- Visit your eye doctor every year.  Safe sex- If you may be exposed to a sexually transmitted infection, use a condom.  Seat belts- Seat belts can save your life; always wear one.  Smoke/Carbon Monoxide detectors- These detectors need to be installed on the appropriate level of your home.  Replace batteries at least once a year.  Skin cancer- When out in the sun, cover up and use sunscreen 15 SPF or higher.  Violence- If anyone is threatening you, please tell your healthcare provider.  Living Will/ Health care power of attorney- Speak with your healthcare provider and family.    If you have lab work done today you will be contacted with your lab results within the next 2 weeks.  If you have not heard from [place] then please contact us. The fastest way to get your results is to register for My Chart.   IF you received an x-ray today, you will receive an invoice from [facility]. Please  contact [facility] at [phone] with questions or concerns regarding your invoice.   IF you received labwork today, you will receive an invoice from [facility]. Please contact [facility] at [phone] with questions or concerns regarding your invoice.   Our billing staff will not be able to assist you with questions regarding bills from these companies.  You will be contacted with the lab results as soon as they are available. The fastest way to get your results is to activate your My Chart account. Instructions are located on the last  page of this paperwork. If you have not heard from [place] regarding the results in 2 weeks, please contact this office.       Signed,   Merri Ray, MD [facility].  11/24/18 12:48 PM

## 2019-09-23 ENCOUNTER — Telehealth: Payer: Self-pay | Admitting: Family Medicine

## 2019-09-23 ENCOUNTER — Other Ambulatory Visit: Payer: Self-pay | Admitting: Emergency Medicine

## 2019-09-23 DIAGNOSIS — I10 Essential (primary) hypertension: Secondary | ICD-10-CM

## 2019-09-23 DIAGNOSIS — R7303 Prediabetes: Secondary | ICD-10-CM

## 2019-09-23 DIAGNOSIS — E785 Hyperlipidemia, unspecified: Secondary | ICD-10-CM

## 2019-09-23 DIAGNOSIS — Z125 Encounter for screening for malignant neoplasm of prostate: Secondary | ICD-10-CM

## 2019-09-23 NOTE — Telephone Encounter (Signed)
Lab order has been placed and patient ws informed.

## 2019-09-23 NOTE — Telephone Encounter (Signed)
Patient requested to have labs drawn one week prior to the annual physical.  Requesting lab orders for nurse visit

## 2019-10-24 ENCOUNTER — Ambulatory Visit (INDEPENDENT_AMBULATORY_CARE_PROVIDER_SITE_OTHER): Payer: Managed Care, Other (non HMO) | Admitting: Family Medicine

## 2019-10-24 ENCOUNTER — Other Ambulatory Visit: Payer: Self-pay

## 2019-10-24 DIAGNOSIS — E785 Hyperlipidemia, unspecified: Secondary | ICD-10-CM

## 2019-10-24 DIAGNOSIS — Z125 Encounter for screening for malignant neoplasm of prostate: Secondary | ICD-10-CM

## 2019-10-24 DIAGNOSIS — R7303 Prediabetes: Secondary | ICD-10-CM

## 2019-10-24 DIAGNOSIS — I10 Essential (primary) hypertension: Secondary | ICD-10-CM

## 2019-10-25 LAB — PSA: Prostate Specific Ag, Serum: 0.8 ng/mL (ref 0.0–4.0)

## 2019-10-25 LAB — LIPID PANEL
Chol/HDL Ratio: 3 ratio (ref 0.0–5.0)
Cholesterol, Total: 182 mg/dL (ref 100–199)
HDL: 61 mg/dL (ref >39)
LDL Chol Calc (NIH): 108 mg/dL — ABNORMAL HIGH (ref 0–99)
Triglycerides: 72 mg/dL (ref 0–149)
VLDL Cholesterol Cal: 13 mg/dL (ref 5–40)

## 2019-10-25 LAB — COMPREHENSIVE METABOLIC PANEL
ALT: 14 IU/L (ref 0–44)
AST: 15 IU/L (ref 0–40)
Albumin/Globulin Ratio: 2 (ref 1.2–2.2)
Albumin: 4.4 g/dL (ref 3.8–4.9)
Alkaline Phosphatase: 59 IU/L (ref 48–121)
BUN/Creatinine Ratio: 22 — ABNORMAL HIGH (ref 9–20)
BUN: 18 mg/dL (ref 6–24)
Bilirubin Total: 0.3 mg/dL (ref 0.0–1.2)
CO2: 23 mmol/L (ref 20–29)
Calcium: 9.2 mg/dL (ref 8.7–10.2)
Chloride: 105 mmol/L (ref 96–106)
Creatinine, Ser: 0.81 mg/dL (ref 0.76–1.27)
GFR calc Af Amer: 113 mL/min/{1.73_m2} (ref >59)
GFR calc non Af Amer: 98 mL/min/{1.73_m2} (ref >59)
Globulin, Total: 2.2 g/dL (ref 1.5–4.5)
Glucose: 100 mg/dL — ABNORMAL HIGH (ref 65–99)
Potassium: 4.3 mmol/L (ref 3.5–5.2)
Sodium: 140 mmol/L (ref 134–144)
Total Protein: 6.6 g/dL (ref 6.0–8.5)

## 2019-10-25 LAB — HEMOGLOBIN A1C
Est. average glucose Bld gHb Est-mCnc: 120 mg/dL
Hgb A1c MFr Bld: 5.8 % — ABNORMAL HIGH (ref 4.8–5.6)

## 2019-10-28 ENCOUNTER — Other Ambulatory Visit: Payer: Self-pay

## 2019-10-28 ENCOUNTER — Ambulatory Visit (INDEPENDENT_AMBULATORY_CARE_PROVIDER_SITE_OTHER): Payer: Managed Care, Other (non HMO) | Admitting: Family Medicine

## 2019-10-28 VITALS — BP 128/83 | HR 51 | Temp 98.0°F | Ht 70.0 in | Wt 191.0 lb

## 2019-10-28 DIAGNOSIS — K439 Ventral hernia without obstruction or gangrene: Secondary | ICD-10-CM | POA: Diagnosis not present

## 2019-10-28 DIAGNOSIS — Z0001 Encounter for general adult medical examination with abnormal findings: Secondary | ICD-10-CM

## 2019-10-28 DIAGNOSIS — F5109 Other insomnia not due to a substance or known physiological condition: Secondary | ICD-10-CM

## 2019-10-28 DIAGNOSIS — R7303 Prediabetes: Secondary | ICD-10-CM

## 2019-10-28 DIAGNOSIS — R001 Bradycardia, unspecified: Secondary | ICD-10-CM | POA: Diagnosis not present

## 2019-10-28 DIAGNOSIS — Z0000 Encounter for general adult medical examination without abnormal findings: Secondary | ICD-10-CM

## 2019-10-28 MED ORDER — ZOLPIDEM TARTRATE 5 MG PO TABS: 5.0000 mg | ORAL_TABLET | Freq: Every evening | ORAL | 0 refills | Status: DC | PRN

## 2019-10-28 NOTE — Patient Instructions (Addendum)
Let me know who you would like me to refer you to for hernia.  Avoid heavy lifting, straining for now.  I will see if we can get the thyroid test added onto your blood work recently.  I would recommend meeting with a cardiologist to discuss the low heart rate and any further testing needed. I will place that referral. Return to the clinic or go to the nearest emergency room if any of your symptoms worsen or new symptoms occur.  Ambien as needed for flying.   Let me know if there are other questions and have a great trip!   Keeping you healthy  Get these tests  Blood pressure- Have your blood pressure checked once a year by your healthcare provider.  Normal blood pressure is 120/80  Weight- Have your body mass index (BMI) calculated to screen for obesity.  BMI is a measure of body fat based on height and weight. You can also calculate your own BMI at [email or web address].  Cholesterol- Have your cholesterol checked every year.  Diabetes- Have your blood sugar checked regularly if you have high blood pressure, high cholesterol, have a family history of diabetes or if you are overweight.  Screening for Colon Cancer- Colonoscopy starting at age 20.  Screening may begin sooner depending on your family history and other health conditions. Follow up colonoscopy as directed by your Gastroenterologist.  Screening for Prostate Cancer- Both blood work (PSA) and a rectal exam help screen for Prostate Cancer.  Screening begins at age 62 with African-American men and at age 54 with Caucasian men.  Screening may begin sooner depending on your family history.  Take these medicines  Aspirin- One aspirin daily can help prevent Heart disease and Stroke.  Flu shot- Every fall.  Tetanus- Every 10 years.  Zostavax- Once after the age of 44 to prevent Shingles.  Pneumonia shot- Once after the age of 69; if you are younger than 5, ask your healthcare provider if you need a Pneumonia shot.  Take  these steps  Don't smoke- If you do smoke, talk to your doctor about quitting.  For tips on how to quit, go to www.smokefree.gov or call 1-800-QUIT-NOW.  Be physically active- Exercise 5 days a week for at least 30 minutes.  If you are not already physically active start slow and gradually work up to 30 minutes of moderate physical activity.  Examples of moderate activity include walking briskly, mowing the yard, dancing, swimming, bicycling, etc.  Eat a healthy diet- Eat a variety of healthy food such as fruits, vegetables, low fat milk, low fat cheese, yogurt, lean meant, poultry, fish, beans, tofu, etc. For more information go to www.thenutritionsource.org  Drink alcohol in moderation- Limit alcohol intake to less than two drinks a day. Never drink and drive.  Dentist- Brush and floss twice daily; visit your dentist twice a year.  Depression- Your emotional health is as important as your physical health. If you're feeling down, or losing interest in things you would normally enjoy please talk to your healthcare provider.  Eye exam- Visit your eye doctor every year.  Safe sex- If you may be exposed to a sexually transmitted infection, use a condom.  Seat belts- Seat belts can save your life; always wear one.  Smoke/Carbon Monoxide detectors- These detectors need to be installed on the appropriate level of your home.  Replace batteries at least once a year.  Skin cancer- When out in the sun, cover up and use sunscreen 15 SPF or higher.  Violence- If anyone is threatening you, please tell your healthcare provider.  Living Will/ Health care power of attorney- Speak with your healthcare provider and family.  Bradycardia, Adult Bradycardia is a slower-than-normal heartbeat. A normal resting heart rate for an adult ranges from 60 to 100 beats per minute. With bradycardia, the resting heart rate is less than 60 beats per minute. Bradycardia can prevent enough oxygen from reaching certain  areas of your body when you are active. It can be serious if it keeps enough oxygen from reaching your brain and other parts of your body. Bradycardia is not a problem for everyone. For some healthy adults, a slow resting heart rate is normal. What are the causes? This condition may be caused by:  A problem with the heart, including: ? A problem with the heart's electrical system, such as a heart block. With a heart block, electrical signals between the chambers of the heart are partially or completely blocked, so they are not able to work as they should. ? A problem with the heart's natural pacemaker (sinus node). ? Heart disease. ? A heart attack. ? Heart damage. ? Lyme disease. ? A heart infection. ? A heart condition that is present at birth (congenital heart defect).  Certain medicines that treat heart conditions.  Certain conditions, such as hypothyroidism and obstructive sleep apnea.  Problems with the balance of chemicals and other substances, like potassium, in the blood.  Trauma.  Radiation therapy. What increases the risk? You are more likely to develop this condition if you:  Are age 58 or older.  Have high blood pressure (hypertension), high cholesterol (hyperlipidemia), or diabetes.  Drink heavily, use tobacco or nicotine products, or use drugs. What are the signs or symptoms? Symptoms of this condition include:  Light-headedness.  Feeling faint or fainting.  Fatigue and weakness.  Trouble with activity or exercise.  Shortness of breath.  Chest pain (angina).  Drowsiness.  Confusion.  Dizziness. How is this diagnosed? This condition may be diagnosed based on:  Your symptoms.  Your medical history.  A physical exam. During the exam, your health care provider will listen to your heartbeat and check your pulse. To confirm the diagnosis, your health care provider may order tests, such as:  Blood tests.  An electrocardiogram (ECG). This test  records the heart's electrical activity. The test can show how fast your heart is beating and whether the heartbeat is steady.  A test in which you wear a portable device (event recorder or Holter monitor) to record your heart's electrical activity while you go about your day.  Anexercise test. How is this treated? Treatment for this condition depends on the cause of the condition and how severe your symptoms are. Treatment may involve:  Treatment of the underlying condition.  Changing your medicines or how much medicine you take.  Having a small, battery-operated device called a pacemaker implanted under the skin. When bradycardia occurs, this device can be used to increase your heart rate and help your heart beat in a regular rhythm. Follow these instructions at home: Lifestyle   Manage any health conditions that contribute to bradycardia as told by your health care provider.  Follow a heart-healthy diet. A nutrition specialist (dietitian) can help educate you about healthy food options and changes.  Follow an exercise program that is approved by your health care provider.  Maintain a healthy weight.  Try to reduce or manage your stress, such as with yoga or meditation. If you need help reducing  stress, ask your health care provider.  Do not use any products that contain nicotine or tobacco, such as cigarettes, e-cigarettes, and chewing tobacco. If you need help quitting, ask your health care provider.  Do not use illegal drugs.  Limit alcohol intake to no more than 1 drink a day for nonpregnant women and 2 drinks a day for men. Be aware of how much alcohol is in your drink. In the [place] one drink equals one 12 oz bottle of beer (355 mL), one 5 oz glass of wine (148 mL), or one 1 oz glass of hard liquor (44 mL). General instructions  Take over-the-counter and prescription medicines only as told by your health care provider.  Keep all follow-up visits as told by your health  care provider. This is important. How is this prevented? In some cases, bradycardia may be prevented by:  Treating underlying medical problems.  Stopping behaviors or medicines that can trigger the condition. Contact a health care provider if you:  Feel light-headed or dizzy.  Almost faint.  Feel weak or are easily fatigued during physical activity.  Experience confusion or have memory problems. Get help right away if:  You faint.  You have: ? An irregular heartbeat (palpitations). ? Chest pain. ? Trouble breathing. Summary  Bradycardia is a slower-than-normal heartbeat. With bradycardia, the resting heart rate is less than 60 beats per minute.  Treatment for this condition depends on the cause.  Manage any health conditions that contribute to bradycardia as told by your health care provider.  Do not use any products that contain nicotine or tobacco, such as cigarettes, e-cigarettes, and chewing tobacco, and limit alcohol intake.  Keep all follow-up visits as told by your health care provider. This is important. This information is not intended to replace advice given to you by your health care provider. Make sure you discuss any questions you have with your health care provider. Document Revised: 09/25/2017 Document Reviewed: 08/23/2017 Elsevier Patient Education  [place], Adult     A hernia is the bulging of an organ or tissue through a weak spot in the muscles of the abdomen (abdominal wall). Hernias develop most often near the belly button (navel) or the area where the leg meets the lower abdomen (groin). Common types of hernias include:  Incisional hernia. This type bulges through a scar from an abdominal surgery.  Umbilical hernia. This type develops near the navel.  Inguinal hernia. This type develops in the groin or scrotum.  Femoral hernia. This type develops under the groin, in the upper thigh area.  Hiatal hernia. This type occurs  when part of the stomach slides above the muscle that separates the abdomen from the chest (diaphragm). What are the causes? This condition may be caused by:  Heavy lifting.  Coughing over a long period of time.  Straining to have a bowel movement. Constipation can lead to straining.  An incision made during an abdominal surgery.  A physical problem that is present at birth (congenital defect).  Being overweight or obese.  Smoking.  Excess fluid in the abdomen.  Undescended testicles in males. What are the signs or symptoms? The main symptom is a skin-colored, rounded bulge in the area of the hernia. However, a bulge may not always be present. It may grow bigger or be more visible when you cough or strain (such as when lifting something heavy). A hernia that can be pushed back into the area (is reducible) rarely causes pain. A  hernia that cannot be pushed back into the area (is incarcerated) may lose its blood supply (become strangulated). A hernia that is incarcerated may cause:  Pain.  Fever.  Nausea and vomiting.  Swelling.  Constipation. How is this diagnosed? A hernia may be diagnosed based on:  Your symptoms and medical history.  A physical exam. Your health care provider may ask you to cough or move in certain ways to see if the hernia becomes visible.  Imaging tests, such as: ? X-rays. ? Ultrasound. ? CT scan. How is this treated? A hernia that is small and painless may not need to be treated. A hernia that is large or painful may be treated with surgery. Inguinal hernias may be treated with surgery to prevent incarceration or strangulation. Strangulated hernias are always treated with surgery because a lack of blood supply to the trapped organ or tissue can cause it to die. Surgery to treat a hernia involves pushing the bulge back into place and repairing the weak area of the muscle or abdominal wall. Follow these instructions at home: Activity  Avoid  straining.  Do not lift anything that is heavier than 10 lb (4.5 kg), or the limit that you are told, until your health care provider says that it is safe.  When lifting heavy objects, lift with your leg muscles, not your back muscles. Preventing constipation  Take actions to prevent constipation. Constipation leads to straining with bowel movements, which can make a hernia worse or cause a hernia repair to break down. Your health care provider may recommend that you: ? Drink enough fluid to keep your urine pale yellow. ? Eat foods that are high in fiber, such as fresh fruits and vegetables, whole grains, and beans. ? Limit foods that are high in fat and processed sugars, such as fried or sweet foods. ? Take an over-the-counter or prescription medicine for constipation. General instructions  When coughing, try to cough gently.  You may try to push the hernia back in place by very gently pressing on it while lying down. Do not try to force the bulge back in if it will not push in easily.  If you are overweight, work with your health care provider to lose weight safely.  Do not use any products that contain nicotine or tobacco, such as cigarettes and e-cigarettes. If you need help quitting, ask your health care provider.  If you are scheduled for hernia repair, watch your hernia for any changes in shape, size, or color. Tell your health care provider about any changes or new symptoms.  Take over-the-counter and prescription medicines only as told by your health care provider.  Keep all follow-up visits as told by your health care provider. This is important. Contact a health care provider if:  You develop new pain, swelling, or redness around your hernia.  You have signs of constipation, such as: ? Fewer bowel movements in a week than normal. ? Difficulty having a bowel movement. ? Stools that are dry, hard, or larger than normal. Get help right away if:  You have a fever.  You  have abdomen pain that gets worse.  You feel nauseous or you vomit.  You cannot push the hernia back in place by very gently pressing on it while lying down. Do not try to force the bulge back in if it will not push in easily.  The hernia: ? Changes in shape, size, or color. ? Feels hard or tender. These symptoms may represent a serious  problem that is an emergency. Do not wait to see if the symptoms will go away. Get medical help right away. Call your local emergency services (911 in the [place] Summary  A hernia is the bulging of an organ or tissue through a weak spot in the muscles of the abdomen (abdominal wall).  The main symptom is a skin-colored, rounded lump (bulge) in the hernia area. However, a bulge may not always be present. It may grow bigger or more visible when you cough or strain (such as when having a bowel movement).  A hernia that is small and painless may not need to be treated. A hernia that is large or painful may be treated with surgery.  Surgery to treat a hernia involves pushing the bulge back into place and repairing the weak part of the abdomen. This information is not intended to replace advice given to you by your health care provider. Make sure you discuss any questions you have with your health care provider. Document Revised: 07/05/2018 Document Reviewed: 12/14/2016 Elsevier Patient Education  [employer].    If you have lab work done today you will be contacted with your lab results within the next 2 weeks.  If you have not heard from [place] then please contact us. The fastest way to get your results is to register for My Chart.   IF you received an x-ray today, you will receive an invoice from [facility]. Please contact [facility] at [phone] with questions or concerns regarding your invoice.   IF you received labwork today, you will receive an invoice from [facility]. Please contact [facility] at [phone] with questions or  concerns regarding your invoice.   Our billing staff will not be able to assist you with questions regarding bills from these companies.  You will be contacted with the lab results as soon as they are available. The fastest way to get your results is to activate your My Chart account. Instructions are located on the last page of this paperwork. If you have not heard from [place] regarding the results in 2 weeks, please contact this office.

## 2019-10-28 NOTE — Progress Notes (Signed)
Subjective:  Patient ID: Patrick Willis, male    DOB: 10/10/1960  Age: 59 y.o. MRN: [id number]  CC:  Chief Complaint  Patient presents with  . Annual Exam    Pt reports as far as his general health he feels good with no complaints. Pt is copncerned about his herria that is now distended. Pt is also concerned about his low heart rate pt reports when he has gone to give blood his heart rate was in the 40s. pt is an active idividule. PT also would like to request an Rx for 1 ambien pt states he dose better on planes when he has 1/2 a tab, and pt is flying this weekend.     HPI Patrick Willis presents for  Annual physical and concerns above.   Hernia: Ventral hernia Irritated for about 24 hrs after some lifting about a month ago. Sore for about a day only. Seems to be pushed out more. Not hard, no warmth/redness. No stool changes, no n/v.  Prior repair with mesh, then revision after tear in mesh. No local surgeon.  Has been seen by Dr. Zella Richer in past - he would like to get his opinion on who to see.   Bradycardia: Noted when donated blood - mid 40's - before giving blood. asx at that time. Last 2 months only.  Exercise with walking daily and more exertion with push mower in yard once per week. Some lightheadedness after this exertion only. Lasts 5 seconds. No syncope, no chest pain, no dyspnea.  No dark stools.  No rx meds, no new supplements.  HR in 50's as a runner in past.   Situational anxiety: Requests Ambien to fly, flying to [place] and [place], [place] this Friday. Kids are in [place] for immersion course. ambien 5mg  has worked well in past - no hangover or parasomnias. Used on plane only.   Hyperglycemia/prediabetes: Recent fasting blood work, glucose 100, A1c slightly elevated at 5.8.  Slight increase from August of last year, but weight has improved.  Borderline LDL at 108, total cholesterol looks okay.   Lab Results  Component Value Date   CHOL 182 10/24/2019   HDL 61  10/24/2019   LDLCALC 108 (H) 10/24/2019   TRIG 72 10/24/2019   CHOLHDL 3.0 10/24/2019    Wt Readings from Last 3 Encounters:  10/28/19 191 lb (86.6 kg)  11/21/18 205 lb 9.6 oz (93.3 kg)  02/14/18 206 lb 12.8 oz (93.8 kg)  The 10-year ASCVD risk score Mikey Bussing DC Jr., et al., 2013) is: 5.7%   Values used to calculate the score:     Age: 17 years     Sex: Male     Is Non-Hispanic African American: No     Diabetic: No     Tobacco smoker: No     Systolic Blood Pressure: 027 mmHg     Is BP treated: No     HDL Cholesterol: 61 mg/dL     Total Cholesterol: 182 mg/dL  Cancer screening: Colonoscopy 01/23/2012 Prostate: Recent test in July 29 normal. Requests DRE today after r/b of testing.  Lab Results  Component Value Date   PSA1 0.8 10/24/2019   PSA1 0.8 11/20/2018   PSA1 0.8 10/24/2017   PSA 0.91 09/03/2015   PSA 0.80 06/02/2014   Immunization History  Administered Date(s) Administered  . Influenza,inj,Quad PF,6+ Mos 11/21/2018  . Influenza-Unspecified 01/28/2018  . Tdap 10/24/2017  . Zoster Recombinat (Shingrix) 10/24/2017, 01/28/2018  Covid vaccine: received Moderna in March, April.  Depression screen [facility] 2/9 10/28/2019 11/21/2018 02/14/2018 11/21/2017 10/24/2017  Decreased Interest 0 0 0 0 0  Down, Depressed, Hopeless 0 0 0 0 0  PHQ - 2 Score 0 0 0 0 0   No exam data present   Dental: every 6 months.   Exercise: walking daily at [employer],  exercise with yardwork. Over 150 minutes per week.    History There are no problems to display for this patient.  Past Medical History:  Diagnosis Date  . Hypertension    Phreesia 10/28/2019   Past Surgical History:  Procedure Laterality Date  . HERNIA REPAIR     No Known Allergies Prior to Admission medications   Medication Sig Start Date End Date Taking? Authorizing Provider  Multiple Vitamins-Minerals (MULTIVITAMIN WITH MINERALS) tablet Take 1 tablet by mouth daily.   Yes [provider]   Social History     Socioeconomic History  . Marital status: Married    Spouse name: Not on file  . Number of children: Not on file  . Years of education: Not on file  . Highest education level: Not on file  Occupational History  . Occupation: Biochemist, clinical  Tobacco Use  . Smoking status: Never Smoker  . Smokeless tobacco: Never Used  Vaping Use  . Vaping Use: Never used  Substance and Sexual Activity  . Alcohol use: Yes    Alcohol/week: 4.0 - 5.0 standard drinks    Types: 4 - 5 Cans of beer per week  . Drug use: No  . Sexual activity: Yes  Other Topics Concern  . Not on file  Social History Narrative   Exercise walking x 2 miles four times/wk   Social Determinants of Health   Financial Resource Strain:   . Difficulty of Paying Living Expenses:   Food Insecurity:   . Worried About Charity fundraiser in the Last Year:   . Arboriculturist in the Last Year:   Transportation Needs:   . Film/video editor (Medical):   Marland Kitchen Lack of Transportation (Non-Medical):   Physical Activity:   . Days of Exercise per Week:   . Minutes of Exercise per Session:   Stress:   . Feeling of Stress :   Social Connections:   . Frequency of Communication with Friends and Family:   . Frequency of Social Gatherings with Friends and Family:   . Attends Religious Services:   . Active Member of Clubs or Organizations:   . Attends Archivist Meetings:   Marland Kitchen Marital Status:   Intimate Partner Violence:   . Fear of Current or Ex-Partner:   . Emotionally Abused:   Marland Kitchen Physically Abused:   . Sexually Abused:     Review of Systems 13 point review of systems per patient health survey noted.  Negative other than as indicated above or in HPI.    Objective:   Vitals:   10/28/19 1318  BP: 128/83  Pulse: (!) 51  Temp: 98 F (36.7 C)  TempSrc: Temporal  SpO2: 99%  Weight: 191 lb (86.6 kg)  Height: 5\' 10"  (1.778 m)     Physical Exam Vitals reviewed.  Constitutional:      Appearance: He is  well-developed.  HENT:     Head: Normocephalic and atraumatic.     Right Ear: External ear normal.     Left Ear: External ear normal.  Eyes:     Conjunctiva/sclera: Conjunctivae normal.     Pupils: Pupils are equal,  round, and reactive to light.  Neck:     Thyroid: No thyromegaly.     Vascular: No carotid bruit.  Cardiovascular:     Rate and Rhythm: Regular rhythm. Bradycardia present.     Pulses: Normal pulses.     Heart sounds: Normal heart sounds. No murmur heard.  No friction rub. No gallop.   Pulmonary:     Effort: Pulmonary effort is normal. No respiratory distress.     Breath sounds: Normal breath sounds. No wheezing.  Abdominal:     General: There is no distension.     Palpations: Abdomen is soft.     Tenderness: There is no abdominal tenderness.     Hernia: A hernia is present. There is no hernia in the left inguinal area or right inguinal area.    Genitourinary:    Prostate: Normal.  Musculoskeletal:        General: No tenderness. Normal range of motion.     Cervical back: Normal range of motion and neck supple.  Lymphadenopathy:     Cervical: No cervical adenopathy.  Skin:    General: Skin is warm and dry.  Neurological:     Mental Status: He is alert and oriented to person, place, and time.     Deep Tendon Reflexes: Reflexes are normal and symmetric.  Psychiatric:        Behavior: Behavior normal.    EKG: sinus bradycardia, first degree block, PR 240, no acute findings.  Compared to 08/09/10,  PR 206, rate 54.     Assessment & Plan:  Patrick Willis is a 59 y.o. male . Annual physical exam   -anticipatory guidance as below in AVS, screening labs above. Health maintenance items as above in HPI discussed/recommended as applicable.   Bradycardia - Plan: EKG 12-Lead, Ambulatory referral to Cardiology  -longstanding bradycardia, now slightly slower. does not appear to be symptomatic. Rare lightheadedness, fleeting after heavy exertion only. Recommended  cardiology follow up to discuss need for further testing or treatment. rtc /ER precautions.   Situational insomnia - Plan: zolpidem (AMBIEN) 5 MG tablet  - ambien for flying, tolerated prior.   Ventral hernia without obstruction or gangrene  - suspected worsening with recent lifting. Reducible. Hernia precautions. Follow up with surgery - he will let me know preferred provider.   Prediabetes  - improved weight and fasting blood sugar, similar A1c. Commended on improvements.   At end of visit, noted slight discoloration of small area of2nd toenail - no recent changes. otc treatment for onychomycosis or follow up with me/derm if persistent.   Meds ordered this encounter  Medications  . zolpidem (AMBIEN) 5 MG tablet    Sig: Take 1 tablet (5 mg total) by mouth at bedtime as needed for sleep.    Dispense:  10 tablet    Refill:  0   Patient Instructions    Let me know who you would like me to refer you to for hernia.  Avoid heavy lifting, straining for now.  I will see if we can get the thyroid test added onto your blood work recently.  I would recommend meeting with a cardiologist to discuss the low heart rate and any further testing needed. I will place that referral. Return to the clinic or go to the nearest emergency room if any of your symptoms worsen or new symptoms occur.  Ambien as needed for flying.   Let me know if there are other questions and have a great trip!   Keeping  you healthy  Get these tests  Blood pressure- Have your blood pressure checked once a year by your healthcare provider.  Normal blood pressure is 120/80  Weight- Have your body mass index (BMI) calculated to screen for obesity.  BMI is a measure of body fat based on height and weight. You can also calculate your own BMI at [email or web address].[email or web address].  Cholesterol- Have your cholesterol checked every year.  Diabetes- Have your blood sugar checked regularly if you have high blood pressure, high  cholesterol, have a family history of diabetes or if you are overweight.  Screening for Colon Cancer- Colonoscopy starting at age 40.  Screening may begin sooner depending on your family history and other health conditions. Follow up colonoscopy as directed by your Gastroenterologist.  Screening for Prostate Cancer- Both blood work (PSA) and a rectal exam help screen for Prostate Cancer.  Screening begins at age 64 with African-American men and at age 73 with Caucasian men.  Screening may begin sooner depending on your family history.  Take these medicines  Aspirin- One aspirin daily can help prevent Heart disease and Stroke.  Flu shot- Every fall.  Tetanus- Every 10 years.  Zostavax- Once after the age of 43 to prevent Shingles.  Pneumonia shot- Once after the age of 73; if you are younger than 63, ask your healthcare provider if you need a Pneumonia shot.  Take these steps  Don't smoke- If you do smoke, talk to your doctor about quitting.  For tips on how to quit, go to www.smokefree.gov or call 1-800-QUIT-NOW.  Be physically active- Exercise 5 days a week for at least 30 minutes.  If you are not already physically active start slow and gradually work up to 30 minutes of moderate physical activity.  Examples of moderate activity include walking briskly, mowing the yard, dancing, swimming, bicycling, etc.  Eat a healthy diet- Eat a variety of healthy food such as fruits, vegetables, low fat milk, low fat cheese, yogurt, lean meant, poultry, fish, beans, tofu, etc. For more information go to www.thenutritionsource.org  Drink alcohol in moderation- Limit alcohol intake to less than two drinks a day. Never drink and drive.  Dentist- Brush and floss twice daily; visit your dentist twice a year.  Depression- Your emotional health is as important as your physical health. If you're feeling down, or losing interest in things you would normally enjoy please talk to your healthcare  provider.  Eye exam- Visit your eye doctor every year.  Safe sex- If you may be exposed to a sexually transmitted infection, use a condom.  Seat belts- Seat belts can save your life; always wear one.  Smoke/Carbon Monoxide detectors- These detectors need to be installed on the appropriate level of your home.  Replace batteries at least once a year.  Skin cancer- When out in the sun, cover up and use sunscreen 15 SPF or higher.  Violence- If anyone is threatening you, please tell your healthcare provider.  Living Will/ Health care power of attorney- Speak with your healthcare provider and family.  Bradycardia, Adult Bradycardia is a slower-than-normal heartbeat. A normal resting heart rate for an adult ranges from 60 to 100 beats per minute. With bradycardia, the resting heart rate is less than 60 beats per minute. Bradycardia can prevent enough oxygen from reaching certain areas of your body when you are active. It can be serious if it keeps enough oxygen from reaching your brain and other parts of your body. Bradycardia is not a problem for everyone.  For some healthy adults, a slow resting heart rate is normal. What are the causes? This condition may be caused by:  A problem with the heart, including: ? A problem with the heart's electrical system, such as a heart block. With a heart block, electrical signals between the chambers of the heart are partially or completely blocked, so they are not able to work as they should. ? A problem with the heart's natural pacemaker (sinus node). ? Heart disease. ? A heart attack. ? Heart damage. ? Lyme disease. ? A heart infection. ? A heart condition that is present at birth (congenital heart defect).  Certain medicines that treat heart conditions.  Certain conditions, such as hypothyroidism and obstructive sleep apnea.  Problems with the balance of chemicals and other substances, like potassium, in the blood.  Trauma.  Radiation  therapy. What increases the risk? You are more likely to develop this condition if you:  Are age 74 or older.  Have high blood pressure (hypertension), high cholesterol (hyperlipidemia), or diabetes.  Drink heavily, use tobacco or nicotine products, or use drugs. What are the signs or symptoms? Symptoms of this condition include:  Light-headedness.  Feeling faint or fainting.  Fatigue and weakness.  Trouble with activity or exercise.  Shortness of breath.  Chest pain (angina).  Drowsiness.  Confusion.  Dizziness. How is this diagnosed? This condition may be diagnosed based on:  Your symptoms.  Your medical history.  A physical exam. During the exam, your health care provider will listen to your heartbeat and check your pulse. To confirm the diagnosis, your health care provider may order tests, such as:  Blood tests.  An electrocardiogram (ECG). This test records the heart's electrical activity. The test can show how fast your heart is beating and whether the heartbeat is steady.  A test in which you wear a portable device (event recorder or Holter monitor) to record your heart's electrical activity while you go about your day.  Anexercise test. How is this treated? Treatment for this condition depends on the cause of the condition and how severe your symptoms are. Treatment may involve:  Treatment of the underlying condition.  Changing your medicines or how much medicine you take.  Having a small, battery-operated device called a pacemaker implanted under the skin. When bradycardia occurs, this device can be used to increase your heart rate and help your heart beat in a regular rhythm. Follow these instructions at home: Lifestyle   Manage any health conditions that contribute to bradycardia as told by your health care provider.  Follow a heart-healthy diet. A nutrition specialist (dietitian) can help educate you about healthy food options and  changes.  Follow an exercise program that is approved by your health care provider.  Maintain a healthy weight.  Try to reduce or manage your stress, such as with yoga or meditation. If you need help reducing stress, ask your health care provider.  Do not use any products that contain nicotine or tobacco, such as cigarettes, e-cigarettes, and chewing tobacco. If you need help quitting, ask your health care provider.  Do not use illegal drugs.  Limit alcohol intake to no more than 1 drink a day for nonpregnant women and 2 drinks a day for men. Be aware of how much alcohol is in your drink. In the [place] one drink equals one 12 oz bottle of beer (355 mL), one 5 oz glass of wine (148 mL), or one 1 oz glass of hard liquor (44 mL). General  instructions  Take over-the-counter and prescription medicines only as told by your health care provider.  Keep all follow-up visits as told by your health care provider. This is important. How is this prevented? In some cases, bradycardia may be prevented by:  Treating underlying medical problems.  Stopping behaviors or medicines that can trigger the condition. Contact a health care provider if you:  Feel light-headed or dizzy.  Almost faint.  Feel weak or are easily fatigued during physical activity.  Experience confusion or have memory problems. Get help right away if:  You faint.  You have: ? An irregular heartbeat (palpitations). ? Chest pain. ? Trouble breathing. Summary  Bradycardia is a slower-than-normal heartbeat. With bradycardia, the resting heart rate is less than 60 beats per minute.  Treatment for this condition depends on the cause.  Manage any health conditions that contribute to bradycardia as told by your health care provider.  Do not use any products that contain nicotine or tobacco, such as cigarettes, e-cigarettes, and chewing tobacco, and limit alcohol intake.  Keep all follow-up visits as told by your health  care provider. This is important. This information is not intended to replace advice given to you by your health care provider. Make sure you discuss any questions you have with your health care provider. Document Revised: 09/25/2017 Document Reviewed: 08/23/2017 Elsevier Patient Education  [place], Adult     A hernia is the bulging of an organ or tissue through a weak spot in the muscles of the abdomen (abdominal wall). Hernias develop most often near the belly button (navel) or the area where the leg meets the lower abdomen (groin). Common types of hernias include:  Incisional hernia. This type bulges through a scar from an abdominal surgery.  Umbilical hernia. This type develops near the navel.  Inguinal hernia. This type develops in the groin or scrotum.  Femoral hernia. This type develops under the groin, in the upper thigh area.  Hiatal hernia. This type occurs when part of the stomach slides above the muscle that separates the abdomen from the chest (diaphragm). What are the causes? This condition may be caused by:  Heavy lifting.  Coughing over a long period of time.  Straining to have a bowel movement. Constipation can lead to straining.  An incision made during an abdominal surgery.  A physical problem that is present at birth (congenital defect).  Being overweight or obese.  Smoking.  Excess fluid in the abdomen.  Undescended testicles in males. What are the signs or symptoms? The main symptom is a skin-colored, rounded bulge in the area of the hernia. However, a bulge may not always be present. It may grow bigger or be more visible when you cough or strain (such as when lifting something heavy). A hernia that can be pushed back into the area (is reducible) rarely causes pain. A hernia that cannot be pushed back into the area (is incarcerated) may lose its blood supply (become strangulated). A hernia that is incarcerated may  cause:  Pain.  Fever.  Nausea and vomiting.  Swelling.  Constipation. How is this diagnosed? A hernia may be diagnosed based on:  Your symptoms and medical history.  A physical exam. Your health care provider may ask you to cough or move in certain ways to see if the hernia becomes visible.  Imaging tests, such as: ? X-rays. ? Ultrasound. ? CT scan. How is this treated? A hernia that is small and painless may not need  to be treated. A hernia that is large or painful may be treated with surgery. Inguinal hernias may be treated with surgery to prevent incarceration or strangulation. Strangulated hernias are always treated with surgery because a lack of blood supply to the trapped organ or tissue can cause it to die. Surgery to treat a hernia involves pushing the bulge back into place and repairing the weak area of the muscle or abdominal wall. Follow these instructions at home: Activity  Avoid straining.  Do not lift anything that is heavier than 10 lb (4.5 kg), or the limit that you are told, until your health care provider says that it is safe.  When lifting heavy objects, lift with your leg muscles, not your back muscles. Preventing constipation  Take actions to prevent constipation. Constipation leads to straining with bowel movements, which can make a hernia worse or cause a hernia repair to break down. Your health care provider may recommend that you: ? Drink enough fluid to keep your urine pale yellow. ? Eat foods that are high in fiber, such as fresh fruits and vegetables, whole grains, and beans. ? Limit foods that are high in fat and processed sugars, such as fried or sweet foods. ? Take an over-the-counter or prescription medicine for constipation. General instructions  When coughing, try to cough gently.  You may try to push the hernia back in place by very gently pressing on it while lying down. Do not try to force the bulge back in if it will not push in  easily.  If you are overweight, work with your health care provider to lose weight safely.  Do not use any products that contain nicotine or tobacco, such as cigarettes and e-cigarettes. If you need help quitting, ask your health care provider.  If you are scheduled for hernia repair, watch your hernia for any changes in shape, size, or color. Tell your health care provider about any changes or new symptoms.  Take over-the-counter and prescription medicines only as told by your health care provider.  Keep all follow-up visits as told by your health care provider. This is important. Contact a health care provider if:  You develop new pain, swelling, or redness around your hernia.  You have signs of constipation, such as: ? Fewer bowel movements in a week than normal. ? Difficulty having a bowel movement. ? Stools that are dry, hard, or larger than normal. Get help right away if:  You have a fever.  You have abdomen pain that gets worse.  You feel nauseous or you vomit.  You cannot push the hernia back in place by very gently pressing on it while lying down. Do not try to force the bulge back in if it will not push in easily.  The hernia: ? Changes in shape, size, or color. ? Feels hard or tender. These symptoms may represent a serious problem that is an emergency. Do not wait to see if the symptoms will go away. Get medical help right away. Call your local emergency services (911 in the [place] Summary  A hernia is the bulging of an organ or tissue through a weak spot in the muscles of the abdomen (abdominal wall).  The main symptom is a skin-colored, rounded lump (bulge) in the hernia area. However, a bulge may not always be present. It may grow bigger or more visible when you cough or strain (such as when having a bowel movement).  A hernia that is small and painless may not need  to be treated. A hernia that is large or painful may be treated with surgery.  Surgery to treat a  hernia involves pushing the bulge back into place and repairing the weak part of the abdomen. This information is not intended to replace advice given to you by your health care provider. Make sure you discuss any questions you have with your health care provider. Document Revised: 07/05/2018 Document Reviewed: 12/14/2016 Elsevier Patient Education  [employer].    If you have lab work done today you will be contacted with your lab results within the next 2 weeks.  If you have not heard from [place] then please contact us. The fastest way to get your results is to register for My Chart.   IF you received an x-ray today, you will receive an invoice from [facility]. Please contact [facility] at [phone] with questions or concerns regarding your invoice.   IF you received labwork today, you will receive an invoice from [facility]. Please contact [facility] at [phone] with questions or concerns regarding your invoice.   Our billing staff will not be able to assist you with questions regarding bills from these companies.  You will be contacted with the lab results as soon as they are available. The fastest way to get your results is to activate your My Chart account. Instructions are located on the last page of this paperwork. If you have not heard from [place] regarding the results in 2 weeks, please contact this office.         Signed, Merri Ray, MD [facility]

## 2019-10-30 NOTE — Addendum Note (Signed)
Addended by: Meredeth Ide on: 10/30/2019 11:17 AM   Modules accepted: Orders

## 2019-11-08 ENCOUNTER — Ambulatory Visit: Payer: Managed Care, Other (non HMO)

## 2019-11-15 ENCOUNTER — Encounter: Payer: Managed Care, Other (non HMO) | Admitting: Family Medicine

## 2019-11-22 ENCOUNTER — Telehealth: Payer: Self-pay | Admitting: Radiology

## 2019-11-22 ENCOUNTER — Encounter: Payer: Self-pay | Admitting: Cardiology

## 2019-11-22 ENCOUNTER — Ambulatory Visit (INDEPENDENT_AMBULATORY_CARE_PROVIDER_SITE_OTHER): Payer: Managed Care, Other (non HMO) | Admitting: Cardiology

## 2019-11-22 ENCOUNTER — Other Ambulatory Visit: Payer: Self-pay

## 2019-11-22 DIAGNOSIS — R001 Bradycardia, unspecified: Secondary | ICD-10-CM | POA: Insufficient documentation

## 2019-11-22 DIAGNOSIS — I440 Atrioventricular block, first degree: Secondary | ICD-10-CM | POA: Diagnosis not present

## 2019-11-22 DIAGNOSIS — R030 Elevated blood-pressure reading, without diagnosis of hypertension: Secondary | ICD-10-CM | POA: Diagnosis not present

## 2019-11-22 NOTE — Patient Instructions (Signed)
Medication Instructions:  No chnges *If you need a refill on your cardiac medications before your next appointment, please call your pharmacy*   Lab Work: Not needed   Testing/Procedures: Your physician has recommended that you wear a  7 DAY ZIO-PATCH monitor. The Zio patch cardiac monitor continuously records heart rhythm data for up to 14 days, this is for patients being evaluated for multiple types heart rhythms. For the first 24 hours post application, please avoid getting the Zio monitor wet in the shower or by excessive sweating during exercise. After that, feel free to carry on with regular activities. Keep soaps and lotions away from the ZIO XT Patch.  This will be mailed to you, please expect 7-10 days to receive.           Follow-Up: At [facility], you and your health needs are our priority.  As part of our continuing mission to provide you with exceptional heart care, we have created designated Provider Care Teams.  These Care Teams include your primary Cardiologist (physician) and Advanced Practice Providers (APPs -  Physician Assistants and Nurse Practitioners) who all work together to provide you with the care you need, when you need it.   Your next appointment:   7 week(s)  The format for your next appointment:    virtual or in person  Provider:   Glenetta Hew, MD   Other Instructions  ZIO XT- Long Term Monitor Instructions   Your physician has requested you wear your ZIO patch monitor___7____days.   This is a single patch monitor.  [employer] supplies one patch monitor per enrollment.  Additional stickers are not available.   Please do not apply patch if you will be having a Nuclear Stress Test, Echocardiogram, Cardiac CT, MRI, or Chest Xray during the time frame you would be wearing the monitor. The patch cannot be worn during these tests.  You cannot remove and re-apply the ZIO XT patch monitor.   Your ZIO patch monitor will be sent USPS Priority mail  from [facility] directly to your home address. The monitor may also be mailed to a PO BOX if home delivery is not available.   It may take 3-5 days to receive your monitor after you have been enrolled.   Once you have received you monitor, please review enclosed instructions.  Your monitor has already been registered assigning a specific monitor serial # to you.   Applying the monitor   Shave hair from upper left chest.   Hold abrader disc by orange tab.  Rub abrader in 40 strokes over left upper chest as indicated in your monitor instructions.   Clean area with 4 enclosed alcohol pads .  Use all pads to assure are is cleaned thoroughly.  Let dry.   Apply patch as indicated in monitor instructions.  Patch will be place under collarbone on left side of chest with arrow pointing upward.   Rub patch adhesive wings for 2 minutes.Remove white label marked "1".  Remove white label marked "2".  Rub patch adhesive wings for 2 additional minutes.   While looking in a mirror, press and release button in center of patch.  A small green light will flash 3-4 times .  This will be your only indicator the monitor has been turned on.     Do not shower for the first 24 hours.  You may shower after the first 24 hours.   Press button if you feel a symptom. You will hear a small click.  Record Date, Time and Symptom in the Patient Log Book.   When you are ready to remove patch, follow instructions on last 2 pages of Patient Log Book.  Stick patch monitor onto last page of Patient Log Book.   Place Patient Log Book in [place] box.  Use locking tab on box and tape box closed securely.  The [employer] has [employer] on it.  Please place in mailbox as soon as possible.  Your physician should have your test results approximately 7 days after the monitor has been mailed back to [employer]   Call [place] at [phone] if you have questions regarding your ZIO XT patch  monitor.  Call them immediately if you see an orange light blinking on your monitor.   If your monitor falls off in less than 4 days contact our Monitor department at [phone].  If your monitor becomes loose or falls off after 4 days call [employer] at [phone] for suggestions on securing your monitor.

## 2019-11-22 NOTE — Telephone Encounter (Signed)
Enrolled patient for a 7 day Zio XT monitor to be mailed to patients home.  

## 2019-11-22 NOTE — Progress Notes (Signed)
Primary Care Provider: [name], MD Cardiologist: No primary care provider on file. Electrophysiologist: None  Clinic Note: Chief Complaint  Patient presents with  . New Patient (Initial Visit)    Bradycardia   HPI:    Patrick Willis is a 59 y.o. male with a PMH below who is being seen today for the evaluation of BRADYCARDIA at the request of [name], MD.  Patrick Willis was seen by Dr. Carlota Raspberry August 2 routine follow-up.  In addition to concern about his hernia, Dr. Carlota Raspberry was concerned about low heart rate in the 40s - noted occasionally when donating blood.  He indicates that he exercises walking daily is able to push mower in the yard once a week.  He has some lightheadedness with exertion last few seconds.  No chest pain or dyspnea.  He was a former runner. --He was referred to cardiology because of his low heart rate.  Recent Hospitalizations: none  Reviewed  CV studies:    The following studies were reviewed today: (if available, images/films reviewed: From Epic Chart or Care Everywhere) . 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  He says he is always noted his heart rates have always been a little bit slow but is never been symptomatic better.  He denies any fatigue or lack of get up and go.  No lightheadedness or dizziness or syncope/near syncope.  He denies any ability to do the walking that he like to do.  Already has noted that was his resting heart rate may be in the high 40s to low 50s, but on occasion he has gone to donate blood which he does routinely and they have noted heart rates as low as the low 40s.  He denies any other signs or symptoms of vasovagal lightheadedness or dizziness associated with giving blood.  Is never had any issues with this.  No diaphoresis or or other symptoms.  We will  medically sleepy rushes to do something he may feel little bit dizzy when he first gets going but otherwise has no problems.  He says he is easily able to exercise and get his heart rate up with exertion.  He has been exercising pretty well but is lost about 20 pounds in the last year or so will change her diet and exercise.  Some mild edema at the end of the day.  No PND orthopnea. He says his blood pressure is a little bit higher than it is today at home.  Sometimes has been as high as 140s over 90s.  CV Review of Symptoms (Summary) Cardiovascular ROS: no chest pain or dyspnea on exertion positive for - Mild exertional dyspnea when he first starts, but not once he gets going.  No fatigue. negative for - edema, irregular heartbeat, orthopnea, palpitations, paroxysmal nocturnal dyspnea, rapid heart rate, shortness of breath or Lightheadedness or dizziness, syncope/near syncope or TIA/amaurosis fugax.  Claudication  The patient does not have symptoms concerning for COVID-19 infection (fever, chills, cough, or new shortness of breath).  The patient is practicing social distancing & Masking.    REVIEWED OF SYSTEMS   Review of Systems  Constitutional: Positive for weight loss (About 20 pounds). Negative for malaise/fatigue.  HENT: Negative for congestion.   Respiratory: Negative for shortness of breath.   Gastrointestinal: Negative for abdominal pain, blood in stool  and melena.  Genitourinary: Negative for hematuria.  Musculoskeletal: Negative for falls and joint pain.  Neurological: Positive for dizziness (Some bit dizziness when he first stands up real fast but otherwise normal). Negative for sensory change, speech change and focal weakness.  Psychiatric/Behavioral: Negative.    I have reviewed and (if needed) personally updated the patient's problem list, medications, allergies, past medical and surgical history, social and family history.   PAST MEDICAL HISTORY   Past Medical History:    Diagnosis Date  . Hypertension    Phreesia 10/28/2019  . Sinus bradycardia on ECG    Rates in 40s and 50s at rest-with 1 AVB    PAST SURGICAL HISTORY   Past Surgical History:  Procedure Laterality Date  . HERNIA REPAIR      MEDICATIONS/ALLERGIES   Current Meds  Medication Sig  . Multiple Vitamins-Minerals (MULTIVITAMIN WITH MINERALS) tablet Take 1 tablet by mouth daily.  . [DISCONTINUED] zolpidem (AMBIEN) 5 MG tablet Take 1 tablet (5 mg total) by mouth at bedtime as needed for sleep.    No Known Allergies  SOCIAL HISTORY/FAMILY HISTORY   Reviewed in Epic:  Social History   Tobacco Use  . Smoking status: Never Smoker  . Smokeless tobacco: Never Used  Vaping Use  . Vaping Use: Never used  Substance Use Topics  . Alcohol use: Yes    Alcohol/week: 4.0 - 5.0 standard drinks    Types: 4 - 5 Cans of beer per week  . Drug use: No   Social History   Social History Narrative   Exercise walking x 2 miles four times/wk      He and his family just returned from a trip to [place].  His twin daughters have been taking part in a high school summer abroad program in [place].  Their trip started in [place] and encompass much the [place] part of [place] including [place] and [place] History family history includes Cancer in his father, maternal grandfather, maternal grandmother, mother, and sister; Diabetes in his paternal grandfather; Hypertension in his father; Sleep apnea in his father; Stroke in his paternal grandfather.   OBJCTIVE -PE, EKG, labs   Wt Readings from Last 3 Encounters:  11/22/19 195 lb (88.5 kg)  10/28/19 191 lb (86.6 kg)  11/21/18 205 lb 9.6 oz (93.3 kg)    Physical Exam: BP 124/90 (BP Location: Left Arm, Patient Position: Sitting, Cuff Size: Normal)   Pulse (!) 50   Ht 5\' 10"  (1.778 m)   Wt 195 lb (88.5 kg)   BMI 27.98 kg/m  Physical Exam Constitutional:      Appearance: Normal appearance. He is normal weight. He is not ill-appearing.   HENT:     Head: Normocephalic and atraumatic.  Neck:     Comments: No JVD or bruit Cardiovascular:     Rate and Rhythm: Regular rhythm.     Pulses: Normal pulses.     Heart sounds: Normal heart sounds. No murmur heard.  No friction rub.  Pulmonary:     Effort: Pulmonary effort is normal. No respiratory distress.     Breath sounds: Normal breath sounds.  Abdominal:     General: Abdomen is flat. Bowel sounds are normal. There is no distension.     Palpations: Abdomen is soft. There is no mass (No HSM).  Musculoskeletal:        General: No swelling. Normal range of motion.     Cervical back: Normal range of motion.  Neurological:  General: No focal deficit present.     Mental Status: He is alert and oriented to person, place, and time. Mental status is at baseline.  Psychiatric:        Mood and Affect: Mood normal.        Behavior: Behavior normal.        Thought Content: Thought content normal.        Judgment: Judgment normal.     Adult ECG Report  Rate: 50 ;  Rhythm: sinus bradycardia and 1 F.  Otherwise normal axis, voltage and durations.;   Narrative Interpretation: Relatively stable EKG.  Recent Labs:   Lab Results  Component Value Date   CHOL 182 10/24/2019   HDL 61 10/24/2019   LDLCALC 108 (H) 10/24/2019   TRIG 72 10/24/2019   CHOLHDL 3.0 10/24/2019   Lab Results  Component Value Date   CREATININE 0.81 10/24/2019   BUN 18 10/24/2019   NA 140 10/24/2019   K 4.3 10/24/2019   CL 105 10/24/2019   CO2 23 10/24/2019   No results found for: TSH  ASSESSMENT/PLAN    Problem List Items Addressed This Visit    Elevated systolic blood pressure reading without diagnosis of hypertension (Chronic)    He does have elevated diastolic pressure today, and he says at home he tends to have slightly higher pressures.  Will reassess in follow-up to see what his pressures tend to be.  May ask him to bring in a blood pressure log.      Sinus bradycardia by  electrocardiogram    Pretty stable sinus bradycardia seems like this is been going on for a long time.  Nothing to suggest chronotropic incompetence.  Would like to see his heart rate responsiveness during the course of the day.  Plan: 2-week monitor (Zio patch)  Avoid AV nodal agents.      Relevant Orders   EKG 12-Lead (Completed)   LONG TERM MONITOR (3-14 DAYS)   First degree AV block    Goes along with sinus bradycardia, would suggest vagal overtone.  Interestingly he is not susceptible to vasovagal syncope.  Plan: Avoid AV nodal agents.      Relevant Orders   EKG 12-Lead (Completed)   LONG TERM MONITOR (3-14 DAYS)       COVID-19 Education: The signs and symptoms of COVID-19 were discussed with the patient and how to seek care for testing (follow up with PCP or arrange E-visit).   The importance of social distancing and COVID-19 vaccination was discussed today.  I spent a total of 18 minutes with the patient spent in direct patient consultation.  Additional time spent with chart review  / charting (studies, outside notes, etc): 8 Total Time: 26 min   Current medicines are reviewed at length with the patient today.  (+/- concerns) n/a  Notice: This dictation was prepared with Dragon dictation along with smaller phrase technology. Any transcriptional errors that result from this process are unintentional and may not be corrected upon review.  Patient Instructions / Medication Changes & Studies & Tests Ordered   Patient Instructions  Medication Instructions:  No chnges *If you need a refill on your cardiac medications before your next appointment, please call your pharmacy*   Lab Work: Not needed   Testing/Procedures: Your physician has recommended that you wear a  7 DAY ZIO-PATCH monitor. The Zio patch cardiac monitor continuously records heart rhythm data for up to 14 days, this is for patients being evaluated for multiple types heart  rhythms. For the first 24  hours post application, please avoid getting the Zio monitor wet in the shower or by excessive sweating during exercise. After that, feel free to carry on with regular activities. Keep soaps and lotions away from the ZIO XT Patch.  This will be mailed to you, please expect 7-10 days to receive.     Follow-Up: At [facility], you and your health needs are our priority.  As part of our continuing mission to provide you with exceptional heart care, we have created designated Provider Care Teams.  These Care Teams include your primary Cardiologist (physician) and Advanced Practice Providers (APPs -  Physician Assistants and Nurse Practitioners) who all work together to provide you with the care you need, when you need it.   Your next appointment:   7 week(s)  The format for your next appointment:    virtual or in person  Provider:   Glenetta Hew, MD   Other Instructions  Studies Ordered:   Orders Placed This Encounter  Procedures  . LONG TERM MONITOR (3-14 DAYS)  . EKG 12-Lead     Glenetta Hew, M.D., M.S. Interventional Cardiologist   Pager # [phone] Phone # [phone] [address]. [place], [place] [address]   Thank you for choosing Heartcare at [facility]!!

## 2019-11-25 ENCOUNTER — Encounter: Payer: Self-pay | Admitting: Cardiology

## 2019-11-25 DIAGNOSIS — R030 Elevated blood-pressure reading, without diagnosis of hypertension: Secondary | ICD-10-CM | POA: Insufficient documentation

## 2019-11-25 NOTE — Assessment & Plan Note (Signed)
He does have elevated diastolic pressure today, and he says at home he tends to have slightly higher pressures.  Will reassess in follow-up to see what his pressures tend to be.  May ask him to bring in a blood pressure log.

## 2019-11-25 NOTE — Assessment & Plan Note (Signed)
Pretty stable sinus bradycardia seems like this is been going on for a long time.  Nothing to suggest chronotropic incompetence.  Would like to see his heart rate responsiveness during the course of the day.  Plan: 2-week monitor (Zio patch)  Avoid AV nodal agents.

## 2019-11-25 NOTE — Assessment & Plan Note (Signed)
Goes along with sinus bradycardia, would suggest vagal overtone.  Interestingly he is not susceptible to vasovagal syncope.  Plan: Avoid AV nodal agents.

## 2019-11-26 ENCOUNTER — Encounter: Payer: Managed Care, Other (non HMO) | Admitting: Family Medicine

## 2019-12-04 ENCOUNTER — Other Ambulatory Visit (INDEPENDENT_AMBULATORY_CARE_PROVIDER_SITE_OTHER): Payer: Managed Care, Other (non HMO)

## 2019-12-04 DIAGNOSIS — R001 Bradycardia, unspecified: Secondary | ICD-10-CM | POA: Diagnosis not present

## 2019-12-04 DIAGNOSIS — I440 Atrioventricular block, first degree: Secondary | ICD-10-CM

## 2020-01-10 ENCOUNTER — Telehealth: Payer: Self-pay | Admitting: *Deleted

## 2020-01-10 ENCOUNTER — Telehealth (INDEPENDENT_AMBULATORY_CARE_PROVIDER_SITE_OTHER): Payer: Managed Care, Other (non HMO) | Admitting: Cardiology

## 2020-01-10 ENCOUNTER — Encounter: Payer: Self-pay | Admitting: Cardiology

## 2020-01-10 DIAGNOSIS — R001 Bradycardia, unspecified: Secondary | ICD-10-CM | POA: Diagnosis not present

## 2020-01-10 DIAGNOSIS — I440 Atrioventricular block, first degree: Secondary | ICD-10-CM | POA: Diagnosis not present

## 2020-01-10 NOTE — Progress Notes (Signed)
Virtual Visit via Telephone Note   This visit type was conducted due to national recommendations for restrictions regarding the COVID-19 Pandemic (e.g. social distancing) in an effort to limit this patient's exposure and mitigate transmission in our community.  Due to his co-morbid illnesses, this patient is at least at moderate risk for complications without adequate follow up.  This format is felt to be most appropriate for this patient at this time.  The patient did not have access to video technology/had technical difficulties with video requiring transitioning to audio format only (telephone).  All issues noted in this document were discussed and addressed.  No physical exam could be performed with this format.  Please refer to the patient's chart for his  consent to telehealth for [facility].   Patient has given verbal permission to conduct this visit via virtual appointment and to bill insurance 01/12/2020 9:11 PM     Evaluation Performed:  Follow-up visit  Date:  01/12/2020   ID:  Patrick Willis, DOB Mar 17, 1961, MRN [id number]  Patient Location: Home Provider Location: Home Office  PCP:  [name], MD  Cardiologist:  No primary care provider on file. Glenetta Hew, MD  Electrophysiologist:  None   Chief Complaint:   Chief Complaint  Patient presents with  . Follow-up    Monitor results    History of Present Illness:    Patrick Willis is a 59 y.o. male with PMH notable for hypertension and recorded sinus bradycardia who presents via audio/video conferencing for a telehealth visit today.  Patrick Willis was last seen on August 27.  We ordered a 2-week monitor to evaluate his heart rate.  Hospitalizations:  . none  Recent - Interim CV studies:   The following studies were reviewed today: . 7-day Zio patch (9/8-15/2021): Predominantly sinus rhythm with minimum heart rate 40 bpm, maximum heart rate 112 bpm and average heart rate 56 bpm.  Rare PACs and PVCs noted.   Very minimal PVCs and bigeminy.  4 runs of SVT/PAT ranging from from 4-20 beats and heart rate up 94 to 129 bpm  Inerval History   Patrick Willis continues to do well.  May feel a little lightheaded with more vigorous activity - I.e. mowing lawn with push mower & would "let up" to maybe pick up a stick or drink water & note feeling lightheaded - dizzy.  Sx last ~ 30 seconds & he was back to normal.  He has not been in that situation since last visit -- he does recall that the episodes happened closer to when he had given blood.   No true syncope or near syncope.  Sx noted better the farther out he went from giving blood.  Cardiovascular ROS: no chest pain or dyspnea on exertion positive for - irregular heartbeat, palpitations and with occasional dizziness after exercise negative for - edema, orthopnea, paroxysmal nocturnal dyspnea, rapid heart rate, shortness of breath or TIA/amaurosis fugax, syncope/near syncope.  BP has been better since weight loss.   ROS:  Please see the history of present illness.    The patient does not have symptoms concerning for COVID-19 infection (fever, chills, cough, or new shortness of breath).  ROS -pertinent positives and negatives, as noted in HPI.  Otherwise negative.  The patient is practicing social distancing.  Past Medical History:  Diagnosis Date  . Hypertension    Phreesia 10/28/2019  . Sinus bradycardia on ECG    Rates in 40s and 50s at rest-with 1 AVB  Past Surgical History:  Procedure Laterality Date  . HERNIA REPAIR       No outpatient medications have been marked as taking for the 01/10/20 encounter (Video Visit) with Leonie Man, MD.     Allergies:   Patient has no known allergies.   Social History   Tobacco Use  . Smoking status: Never Smoker  . Smokeless tobacco: Never Used  Vaping Use  . Vaping Use: Never used  Substance Use Topics  . Alcohol use: Yes    Alcohol/week: 4.0 - 5.0 standard drinks    Types: 4 - 5 Cans of  beer per week  . Drug use: No     Family Hx: The patient's family history includes Cancer in his father, maternal grandfather, maternal grandmother, mother, and sister; Diabetes in his paternal grandfather; Hypertension in his father; Sleep apnea in his father; Stroke in his paternal grandfather.   Labs/Other Tests and Data Reviewed:    EKG:  No ECG reviewed.  Recent Labs: 10/24/2019: ALT 14; BUN 18; Creatinine, Ser 0.81; Potassium 4.3; Sodium 140   Recent Lipid Panel Lab Results  Component Value Date/Time   CHOL 182 10/24/2019 09:48 AM   TRIG 72 10/24/2019 09:48 AM   HDL 61 10/24/2019 09:48 AM   CHOLHDL 3.0 10/24/2019 09:48 AM   CHOLHDL 2.4 09/03/2015 10:47 AM   LDLCALC 108 (H) 10/24/2019 09:48 AM    Wt Readings from Last 3 Encounters:  01/10/20 190 lb (86.2 kg)  11/22/19 195 lb (88.5 kg)  10/28/19 191 lb (86.6 kg)     Objective:    Vital Signs:  Ht 5\' 10"  (1.778 m)   Wt 190 lb (86.2 kg)   BMI 27.26 kg/m   No vitals Pleasant male in NO acute distress. A&O x 3.  Normal Mood & Affect Non-labored respirations   ASSESSMENT & PLAN:    Relatively reassuring results noted on monitor.  As such, would not be overly worried about "low heart rate ".  Suspect overall high vagal tone.  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  Without significant symptoms I suspect this should be relatively benign.  Problem List Items Addressed This Visit    Sinus bradycardia by electrocardiogram    Despite having bradycardia noted on his EKG, his lowest heart rate recorded on the monitor was 56 bpm at night during hours of sleep.  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  Was actually able to get his heart rate up with exercise.      First degree AV block    Probably suggestive of hyper vagal tone.  If he were to develop hypertension, would avoid AV nodal agents.          COVID-19 Education: The signs and symptoms of COVID-19 were discussed with the patient and how to seek care for testing (follow up with PCP or arrange E-visit).   The importance of social distancing was discussed today.  Time:   Today, I have spent 30 minutes with the patient with telehealth technology discussing the above problems.  We went into detail, the results of his monitor, and reviewed potential etiologies bradycardia.   Medication Adjustments/Labs and Tests Ordered: Current medicines are reviewed at length with the patient today.  Concerns regarding medicines are outlined above.   Patient Instructions  Medication Instructions:  none  *If you need a refill on your cardiac medications  before your next appointment, please call your pharmacy*   Lab Work: n/a    Testing/Procedures: n/a   Follow-Up: At [facility], you and your health needs are our priority.  As part of our continuing mission to provide you with exceptional heart care, we have created designated Provider Care Teams.  These Care Teams include your primary Cardiologist (physician) and Advanced Practice Providers (APPs -  Physician Assistants and Nurse Practitioners) who all work together to provide you with the care you need, when you need it.    Your next appointment:   2 year(s)  The format for your next appointment:   In Person  Provider:   Glenetta Hew, MD   Other Instructions  Keep staying active & exercising.  We can follow-up in ~2-2.5 years     Signed, Glenetta Hew, MD  01/12/2020 9:11 PM    [facility]

## 2020-01-10 NOTE — Patient Instructions (Addendum)
Medication Instructions:  none  *If you need a refill on your cardiac medications before your next appointment, please call your pharmacy*   Lab Work: n/a    Testing/Procedures: n/a   Follow-Up: At [facility], you and your health needs are our priority.  As part of our continuing mission to provide you with exceptional heart care, we have created designated Provider Care Teams.  These Care Teams include your primary Cardiologist (physician) and Advanced Practice Providers (APPs -  Physician Assistants and Nurse Practitioners) who all work together to provide you with the care you need, when you need it.    Your next appointment:   2 year(s)  The format for your next appointment:   In Person  Provider:   Glenetta Hew, MD   Other Instructions  Keep staying active & exercising.  We can follow-up in ~2-2.5 years

## 2020-01-10 NOTE — Telephone Encounter (Signed)
  Patient Consent for Virtual Visit         Patrick Willis has provided verbal consent on 01/10/2020 for a virtual visit (video or telephone).   CONSENT FOR VIRTUAL VISIT FOR:  Patrick Willis  By participating in this virtual visit I agree to the following:  I hereby voluntarily request, consent and authorize [place] and its employed or contracted physicians, physician assistants, nurse practitioners or other licensed health care professionals (the Practitioner), to provide me with telemedicine health care services (the "Services") as deemed necessary by the treating Practitioner. I acknowledge and consent to receive the Services by the Practitioner via telemedicine. I understand that the telemedicine visit will involve communicating with the Practitioner through live audiovisual communication technology and the disclosure of certain medical information by electronic transmission. I acknowledge that I have been given the opportunity to request an in-person assessment or other available alternative prior to the telemedicine visit and am voluntarily participating in the telemedicine visit.  I understand that I have the right to withhold or withdraw my consent to the use of telemedicine in the course of my care at any time, without affecting my right to future care or treatment, and that the Practitioner or I may terminate the telemedicine visit at any time. I understand that I have the right to inspect all information obtained and/or recorded in the course of the telemedicine visit and may receive copies of available information for a reasonable fee.  I understand that some of the potential risks of receiving the Services via telemedicine include:  Marland Kitchen Delay or interruption in medical evaluation due to technological equipment failure or disruption; . Information transmitted may not be sufficient (e.g. poor resolution of images) to allow for appropriate medical decision making by the Practitioner;  and/or  . In rare instances, security protocols could fail, causing a breach of personal health information.  Furthermore, I acknowledge that it is my responsibility to provide information about my medical history, conditions and care that is complete and accurate to the best of my ability. I acknowledge that Practitioner's advice, recommendations, and/or decision may be based on factors not within their control, such as incomplete or inaccurate data provided by me or distortions of diagnostic images or specimens that may result from electronic transmissions. I understand that the practice of medicine is not an exact science and that Practitioner makes no warranties or guarantees regarding treatment outcomes. I acknowledge that a copy of this consent can be made available to me via my patient portal ([facility]), or I can request a printed copy by calling the office of [place].    I understand that my insurance will be billed for this visit.   I have read or had this consent read to me. . I understand the contents of this consent, which adequately explains the benefits and risks of the Services being provided via telemedicine.  . I have been provided ample opportunity to ask questions regarding this consent and the Services and have had my questions answered to my satisfaction. . I give my informed consent for the services to be provided through the use of telemedicine in my medical care

## 2020-01-10 NOTE — Telephone Encounter (Signed)
RN LEFT DETAIL MESSAGE FOR  PATIENT ON VOICEMAIL . Instruction were given  ABOUT today's virtual visit 01/10/20 .  AVS SUMMARY has been sent by mychart .  RECALL PLACED. ANY QUESTION MAY CALL BACK

## 2020-01-12 ENCOUNTER — Encounter: Payer: Self-pay | Admitting: Cardiology

## 2020-01-12 NOTE — Assessment & Plan Note (Signed)
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  If he were to develop hypertension, would avoid AV nodal agents.

## 2020-01-12 NOTE — Assessment & Plan Note (Signed)
Despite having bradycardia noted on his EKG, his lowest heart rate recorded on the monitor was 56 bpm at night during hours of sleep.  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  Was actually able to get his heart rate up with exercise.

## 2020-04-21 ENCOUNTER — Encounter: Payer: Self-pay | Admitting: Family Medicine

## 2020-04-21 DIAGNOSIS — K439 Ventral hernia without obstruction or gangrene: Secondary | ICD-10-CM

## 2020-06-02 ENCOUNTER — Other Ambulatory Visit: Payer: Self-pay | Admitting: Surgery

## 2020-06-02 ENCOUNTER — Other Ambulatory Visit: Payer: Self-pay | Admitting: Psychiatry

## 2020-06-02 DIAGNOSIS — K432 Incisional hernia without obstruction or gangrene: Secondary | ICD-10-CM

## 2020-06-18 ENCOUNTER — Ambulatory Visit
Admission: RE | Admit: 2020-06-18 | Discharge: 2020-06-18 | Disposition: A | Discharge status: Home / Self Care | Payer: Managed Care, Other (non HMO) | Source: Ambulatory Visit | Attending: Surgery | Admitting: Surgery

## 2020-06-18 DIAGNOSIS — K432 Incisional hernia without obstruction or gangrene: Secondary | ICD-10-CM

## 2020-06-18 IMAGING — CT CT ABD-PELV W/ CM
2 of 5 series · 16 of 46 positions shown, 18 images · IV contrast (iopamidol)
Comparison: None.

CLINICAL DATA: Recurrent ventral abdominal wall hernia. Previous
hernia repair.

EXAM:
CT ABDOMEN AND PELVIS WITH CONTRAST
TECHNIQUE: Multidetector CT imaging of the abdomen and pelvis was performed
using the standard protocol following bolus administration of
intravenous contrast.
CONTRAST:  100mL [62] IOPAMIDOL ([62]) INJECTION 61%

[Series 2: abd pelvis 5.00 br40 s3 axial · axial · 0.82mm/px · z∈[+1158,+1598]mm · 13 of 102 slices shown, 15 images]
[im 7/102  soft-tissue]
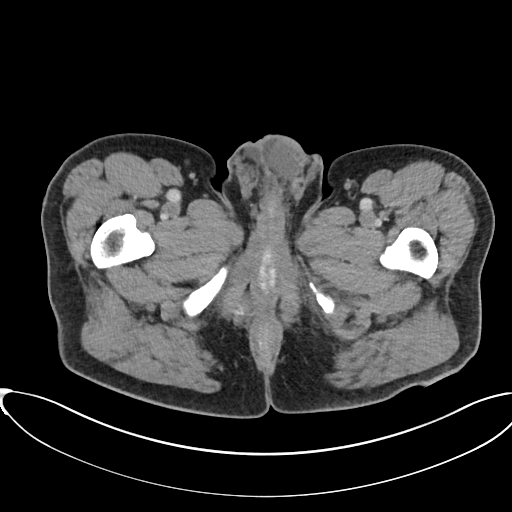
[im 7/102  bone]
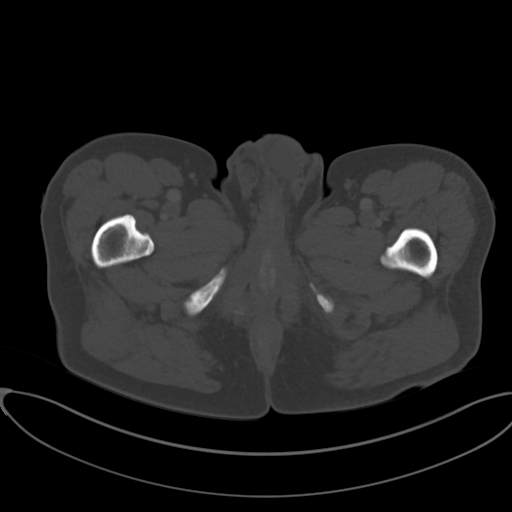
[im 13/102  soft-tissue]
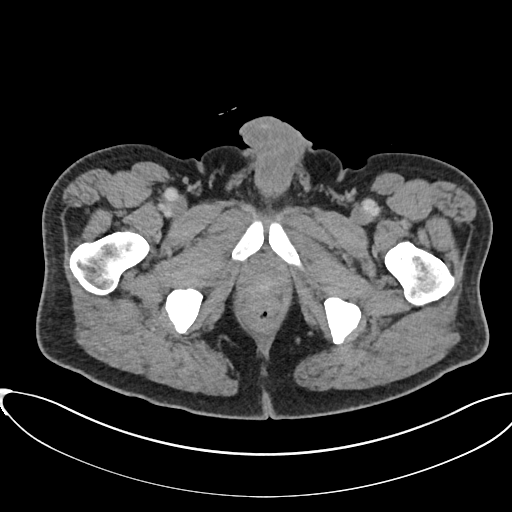
[im 19/102  soft-tissue]
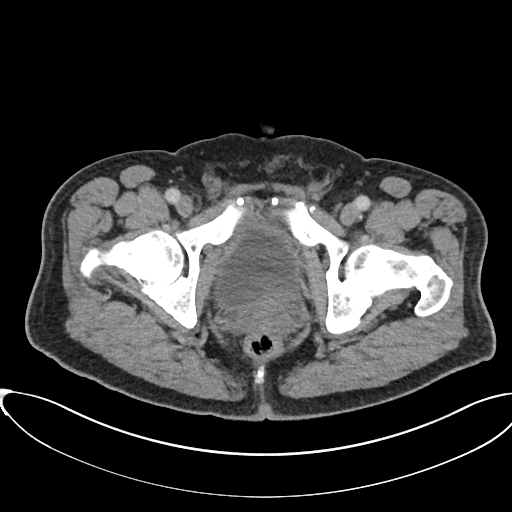
[im 32/102  soft-tissue]
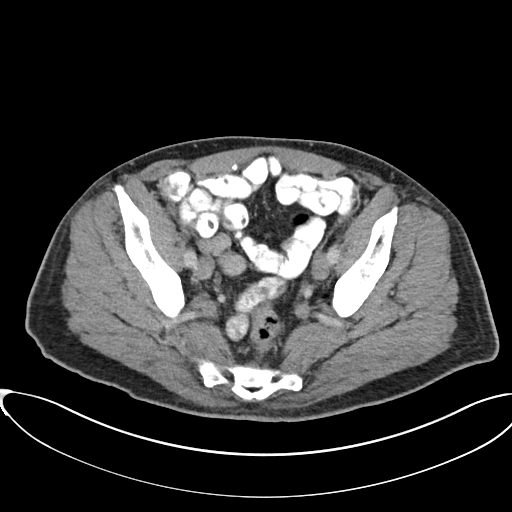
[im 38/102  soft-tissue]
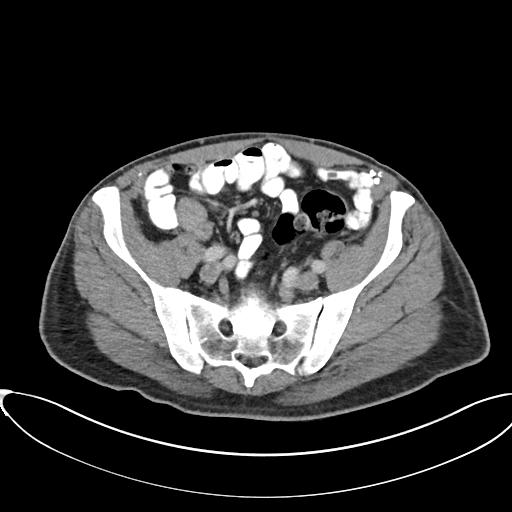
[im 45/102  soft-tissue]
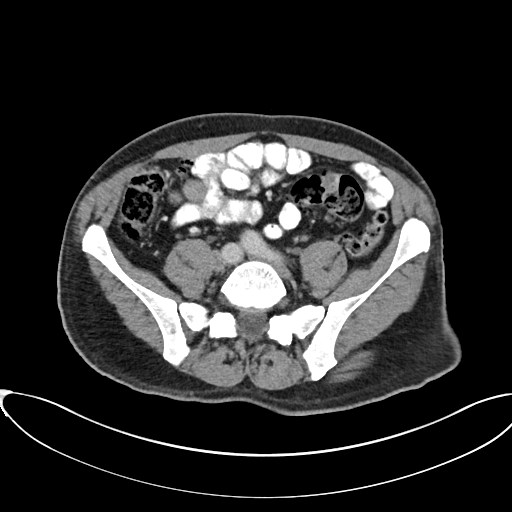
[im 51/102  soft-tissue]
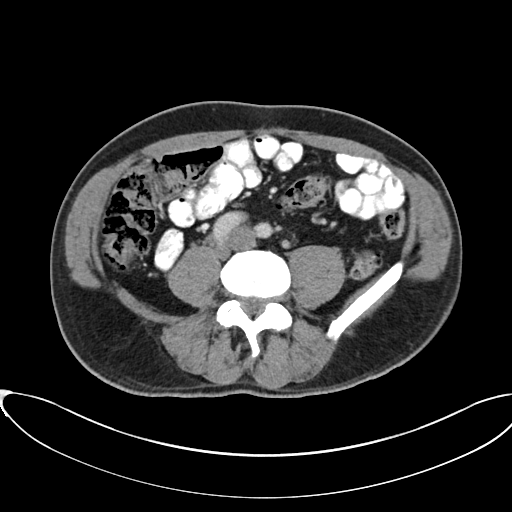
[im 57/102  soft-tissue]
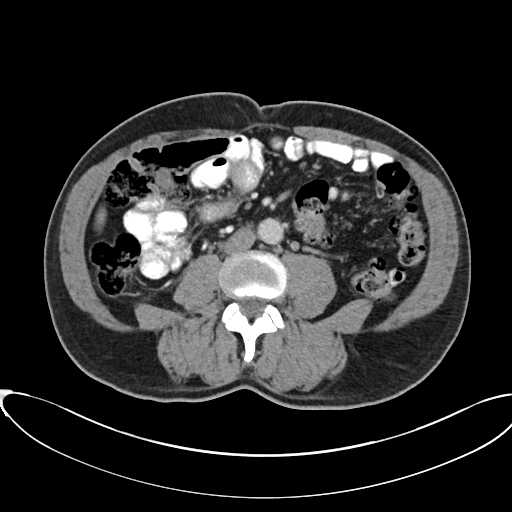
[im 64/102  soft-tissue]
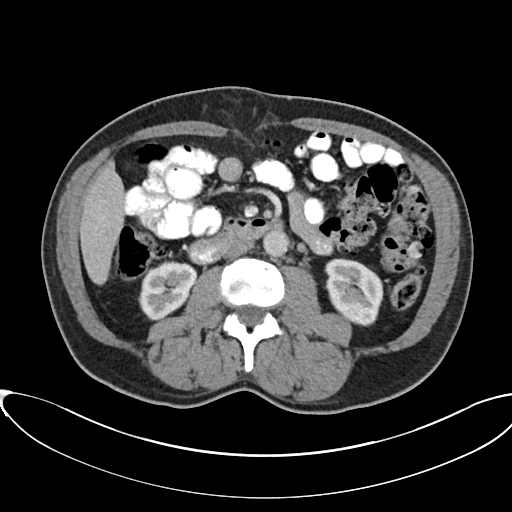
[im 64/102  bone]
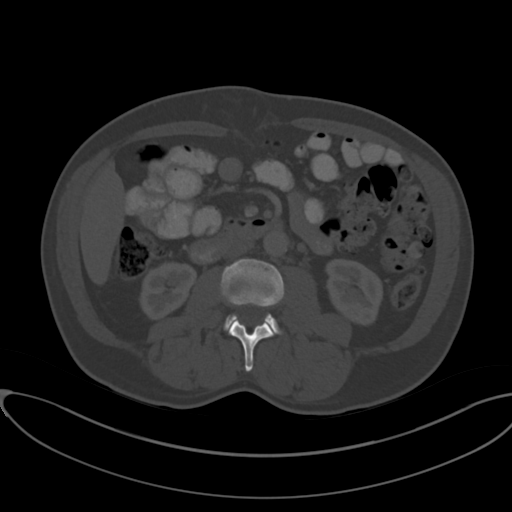
[im 70/102  soft-tissue]
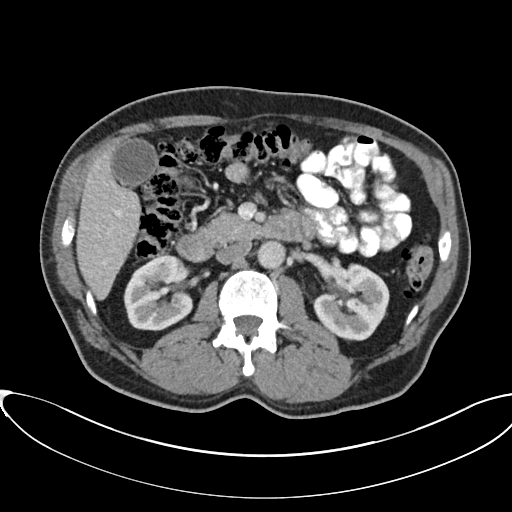
[im 83/102  soft-tissue]
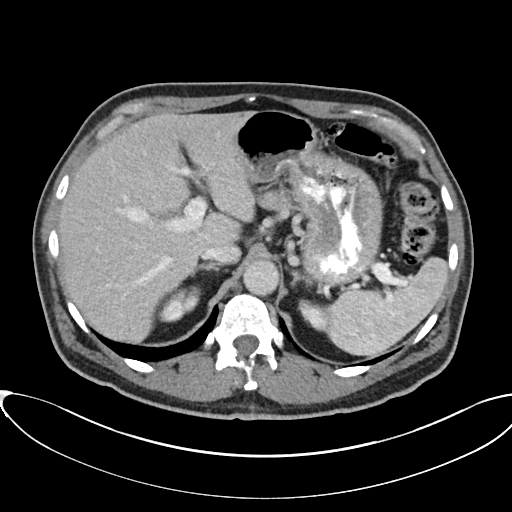
[im 89/102  soft-tissue]
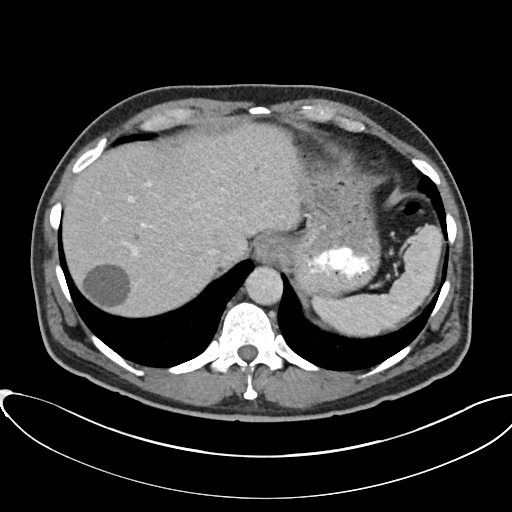
[im 95/102  soft-tissue]
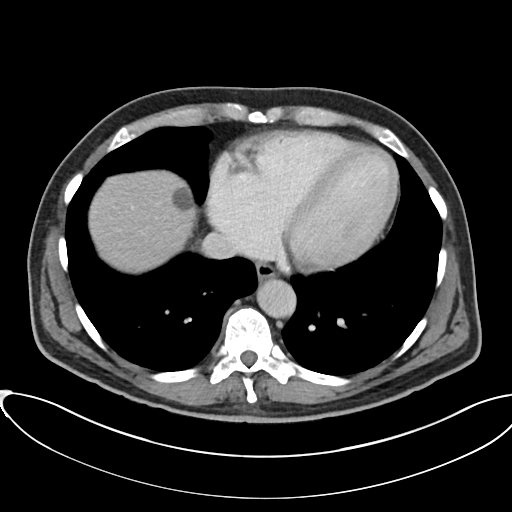

[Series 6: abd pelvis 2.00 br40 s3 cor · coronal · 0.82mm/px · 3 of 179 slices shown]
[im 60/179  soft-tissue]
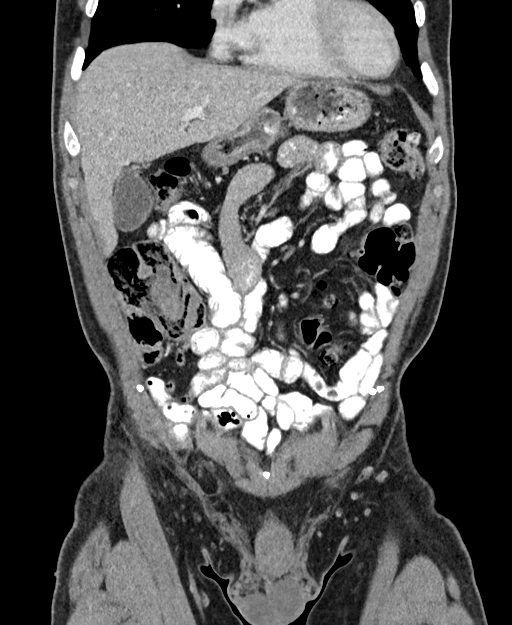
[im 80/179  soft-tissue]
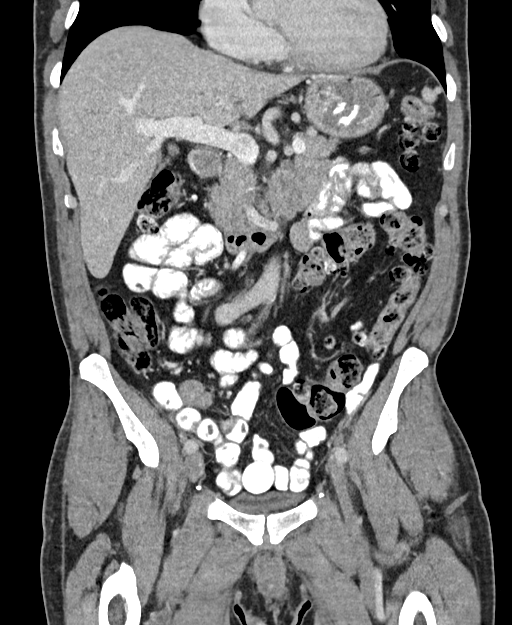
[im 99/179  soft-tissue]
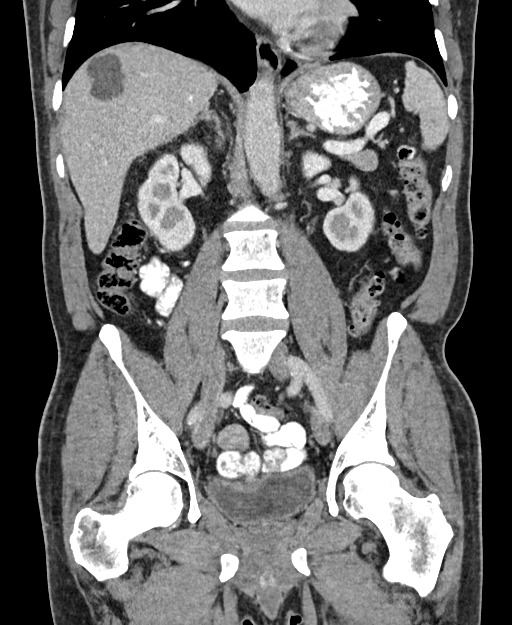

[16 of 46 positions shown; findings below may reference images not displayed]

FINDINGS: Lower Chest: No acute findings.

Hepatobiliary: Multiple hepatic cysts are seen, largest in the
posterior segment of the right lobe measuring 3.9 cm. No hepatic
masses identified. Gallbladder is unremarkable. No evidence of
biliary ductal dilatation.

Pancreas:  No mass or inflammatory changes.

Spleen: No evidence of splenomegaly. A 2.1 cm low-attenuation lesion
is seen in posterior aspect of spleen which has nonspecific
features. No other splenic lesions identified.

Adrenals/Urinary Tract: No masses identified. A few tiny sub-cm
cysts are seen in both kidneys. No evidence of ureteral calculi or
hydronephrosis. Unremarkable unopacified urinary bladder.

Stomach/Bowel: No evidence of obstruction, inflammatory process or
abnormal fluid collections. Diffuse colonic diverticulosis is seen
with greatest involvement of the descending colon, however there is
no evidence of diverticulitis. Normal appendix visualized.

Vascular/Lymphatic: No pathologically enlarged lymph nodes. No
abdominal aortic aneurysm. Aortic atherosclerotic calcification
noted.

Reproductive:  Unremarkable.

Other: Small supraumbilical ventral hernia is seen just to the right
of midline which contains only omental fat. No evidence of herniated
bowel loops. Surgical mesh noted in the suprapubic abdominal wall,
however there is no evidence of recurrent hernia in this region.

Musculoskeletal:  No suspicious bone lesions identified.
IMPRESSION: Small right paramedian epigastric ventral hernia containing only
omental fat. No evidence of herniated bowel loops.

Colonic diverticulosis. No radiographic evidence of diverticulitis.

2.1 cm indeterminate low-attenuation lesion in spleen. Recommend
follow-up imaging in 6 months, preferably with MRI. This
recommendation follows ACR consensus guidelines: White Paper of the
ACR Incidental Findings Committee II on Splenic and Nodal Findings.
[HOSPITAL] [62];[DATE].

Aortic Atherosclerosis ([62]-[62]).

## 2020-06-18 MED ORDER — IOPAMIDOL (ISOVUE-300) INJECTION 61%
100.0000 mL | Freq: Once | INTRAVENOUS | Status: AC | PRN
  Administered 2020-06-18: 100 mL via INTRAVENOUS

## 2020-10-22 ENCOUNTER — Telehealth: Payer: Self-pay | Admitting: Family Medicine

## 2020-10-22 ENCOUNTER — Encounter: Payer: Self-pay | Admitting: Family Medicine

## 2020-10-22 DIAGNOSIS — E785 Hyperlipidemia, unspecified: Secondary | ICD-10-CM

## 2020-10-22 DIAGNOSIS — R7303 Prediabetes: Secondary | ICD-10-CM

## 2020-10-22 DIAGNOSIS — Z125 Encounter for screening for malignant neoplasm of prostate: Secondary | ICD-10-CM

## 2020-10-22 NOTE — Telephone Encounter (Signed)
Patient called to set up his physical appointment for October.  He requested that his labs be done before the appointment.  I set him up for 12/23/2020.  Please put labs in system.  Thank you.

## 2020-10-23 NOTE — Telephone Encounter (Signed)
Labs ordered.

## 2020-12-02 ENCOUNTER — Other Ambulatory Visit: Payer: Self-pay | Admitting: Surgery

## 2020-12-02 DIAGNOSIS — K439 Ventral hernia without obstruction or gangrene: Secondary | ICD-10-CM

## 2020-12-22 ENCOUNTER — Other Ambulatory Visit: Payer: Self-pay

## 2020-12-22 ENCOUNTER — Ambulatory Visit
Admission: RE | Admit: 2020-12-22 | Discharge: 2020-12-22 | Disposition: A | Discharge status: Home / Self Care | Payer: Managed Care, Other (non HMO) | Source: Ambulatory Visit | Attending: Surgery | Admitting: Surgery

## 2020-12-22 DIAGNOSIS — Z8719 Personal history of other diseases of the digestive system: Secondary | ICD-10-CM | POA: Insufficient documentation

## 2020-12-22 DIAGNOSIS — K439 Ventral hernia without obstruction or gangrene: Secondary | ICD-10-CM

## 2020-12-22 IMAGING — MR MR ABDOMEN WO/W CM
17 series · 48 of 48 positions shown · IV contrast (multihance)
Comparison: None.

CLINICAL DATA: Prior CT of the abdomen with splenic lesion by
report in a 60-year-old male.

EXAM:
MRI ABDOMEN WITHOUT AND WITH CONTRAST
TECHNIQUE: Multiplanar multisequence MR imaging of the abdomen was performed
both before and after the administration of intravenous contrast.
CONTRAST:  19mL MULTIHANCE GADOBENATE DIMEGLUMINE 529 MG/ML IV SOLN

[Series 2: T2 · axial · 5.0mm · 1.56mm/px · 1 of 47 slices shown (1 of 3)]
[im 1/47]
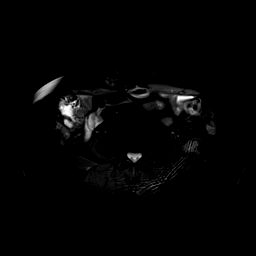

[Series 4: T2 · coronal · 5.0mm · 1.56mm/px · 1 of 43 slices shown (2 of 3)]
[im 1/43]
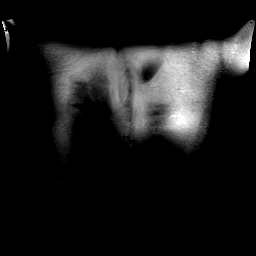

[Series 5: T1 · axial · 3.0mm · 1.25mm/px · z∈[-122,+115]mm · 6 of 160 slices shown]
[im 1/160]
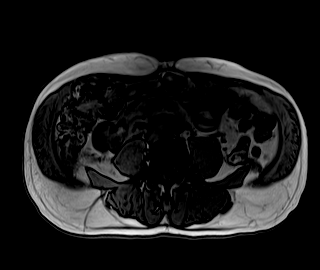
[im 32/160]
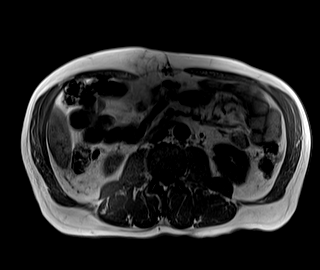
[im 64/160]
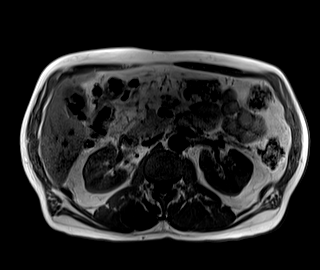
[im 96/160]
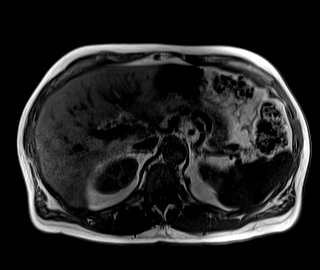
[im 128/160]
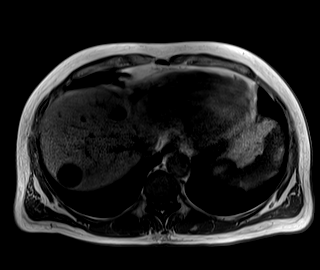
[im 160/160]
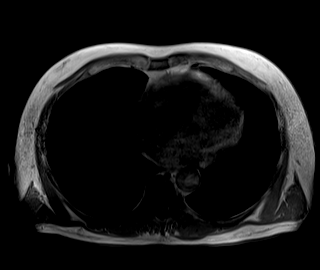

[Series 6: bSSFP · axial · 5.0mm · 1.25mm/px · z∈[-126,+120]mm · 2 of 42 slices shown]
[im 1/42]
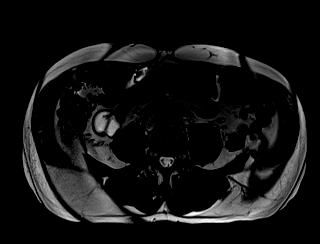
[im 42/42]
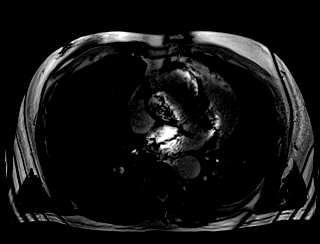

[Series 7: DWI · axial · 5.0mm · 1.42mm/px · z∈[-129,+117]mm · 5 of 126 slices shown (1 of 2)]
[im 1/126]
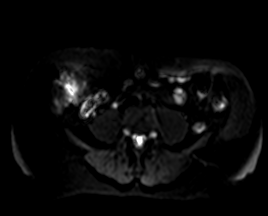
[im 32/126]
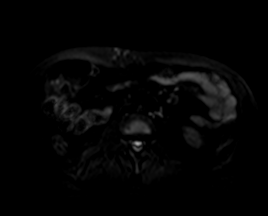
[im 63/126]
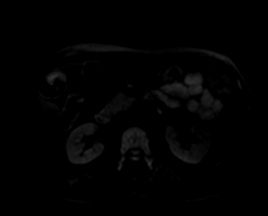
[im 94/126]
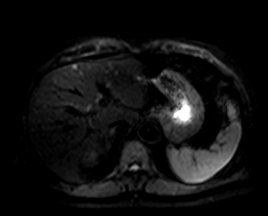
[im 126/126]
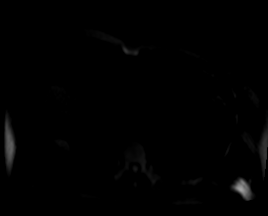

[Series 8: DWI · axial · 5.0mm · 1.42mm/px · z∈[-129,+117]mm · 2 of 42 slices shown (2 of 2)]
[im 1/42]
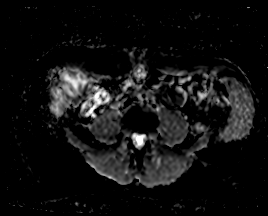
[im 42/42]
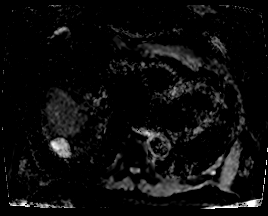

[Series 9: T2 · axial · 6.0mm · 1.56mm/px · 1 of 38 slices shown (3 of 3)]
[im 1/38]
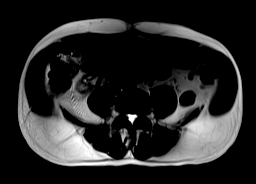

[Series 10: T1 dynamic · axial · non-contrast · 3.0mm · 1.25mm/px · z∈[-123,+114]mm · 3 of 80 slices shown]
[im 1/80]
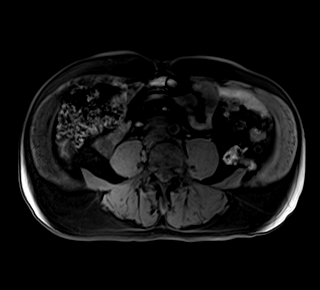
[im 40/80]
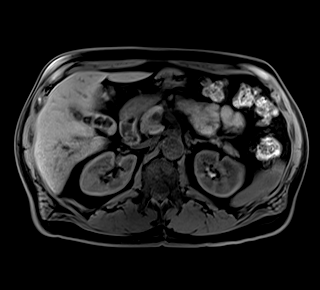
[im 80/80]
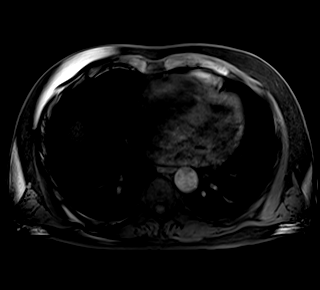

[Series 11: T1 dynamic post-contrast · axial · 3.0mm · 1.25mm/px · z∈[-123,+114]mm · 3 of 80 slices shown (1 of 9)]
[im 1/80]
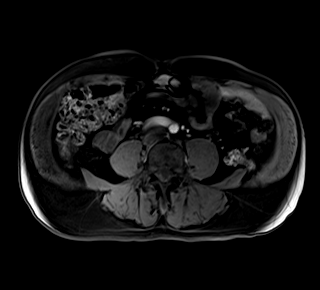
[im 40/80]
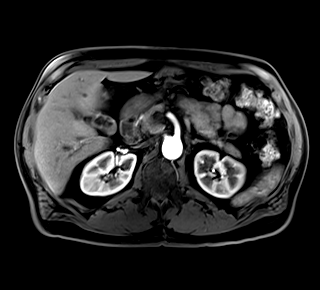
[im 80/80]
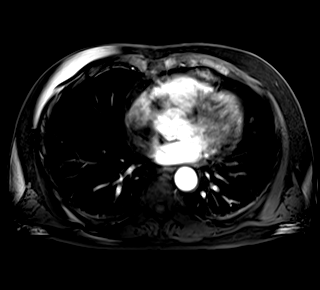

[Series 12: T1 dynamic post-contrast · axial · 3.0mm · 1.25mm/px · z∈[-123,+114]mm · 3 of 80 slices shown (2 of 9)]
[im 1/80]
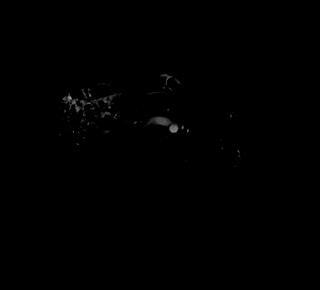
[im 40/80]
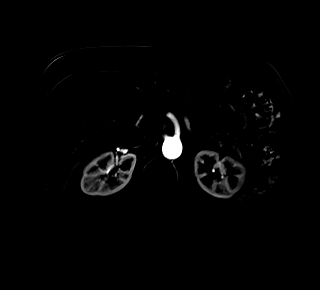
[im 80/80]
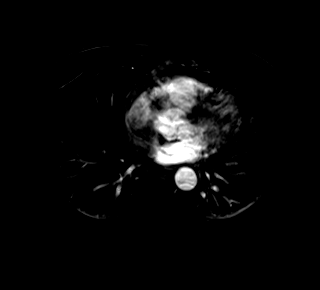

[Series 13: T1 dynamic post-contrast · axial · 3.0mm · 1.25mm/px · z∈[-123,+114]mm · 3 of 80 slices shown (3 of 9)]
[im 1/80]
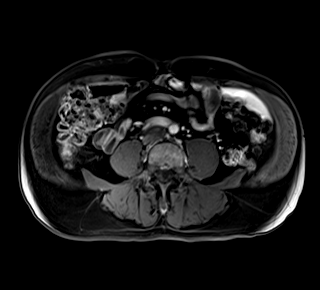
[im 40/80]
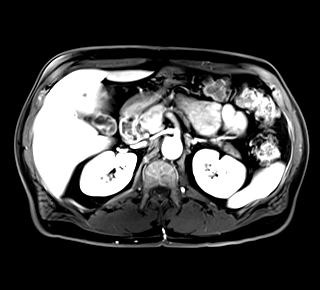
[im 80/80]
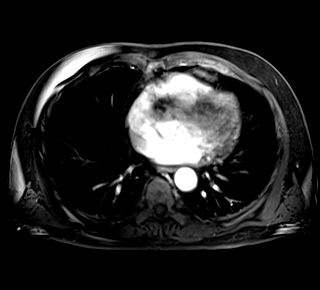

[Series 14: T1 dynamic post-contrast · axial · 3.0mm · 1.25mm/px · z∈[-123,+114]mm · 3 of 80 slices shown (4 of 9)]
[im 1/80]
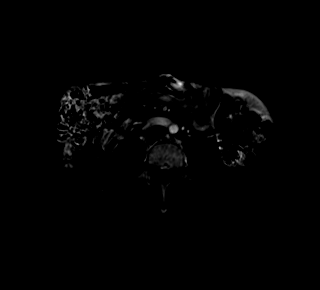
[im 40/80]
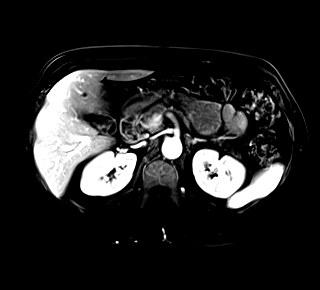
[im 80/80]
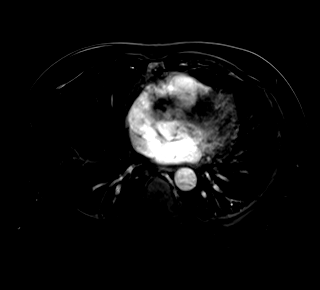

[Series 15: T1 dynamic post-contrast · axial · 3.0mm · 1.25mm/px · z∈[-123,+114]mm · 3 of 80 slices shown (5 of 9)]
[im 1/80]
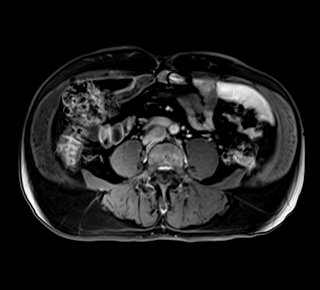
[im 40/80]
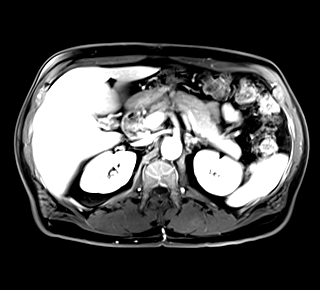
[im 80/80]
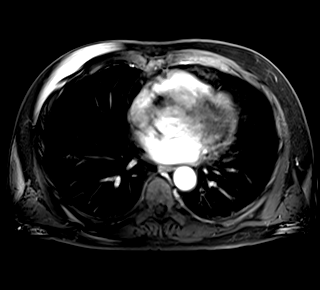

[Series 16: T1 dynamic post-contrast · axial · 3.0mm · 1.25mm/px · z∈[-123,+114]mm · 3 of 80 slices shown (6 of 9)]
[im 1/80]
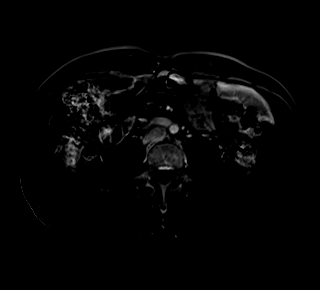
[im 40/80]
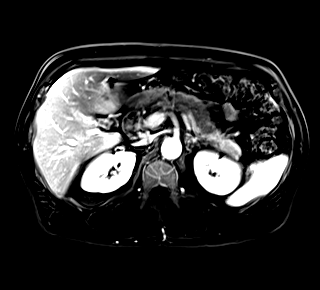
[im 80/80]
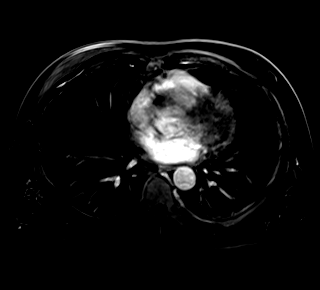

[Series 17: T1 dynamic post-contrast · coronal · 3.0mm · 1.25mm/px · 3 of 80 slices shown (7 of 9)]
[im 1/80]
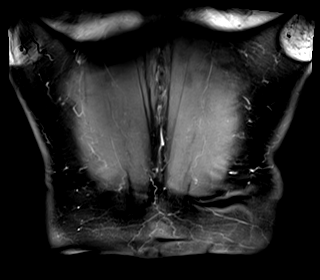
[im 40/80]
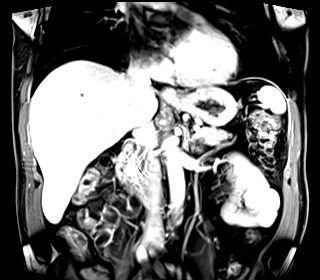
[im 80/80]
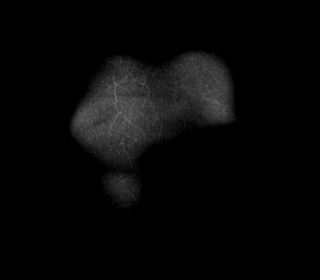

[Series 18: T1 dynamic post-contrast · axial · 3.0mm · 1.25mm/px · z∈[-123,+114]mm · 3 of 80 slices shown (8 of 9)]
[im 1/80]
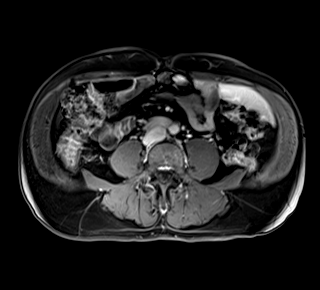
[im 40/80]
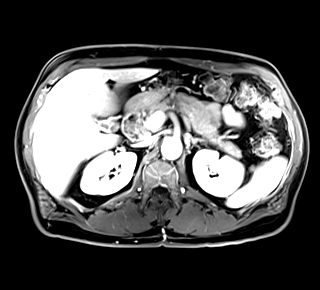
[im 80/80]
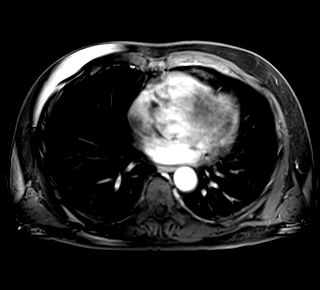

[Series 19: T1 dynamic post-contrast · axial · 3.0mm · 1.25mm/px · z∈[-123,+114]mm · 3 of 80 slices shown (9 of 9)]
[im 1/80]
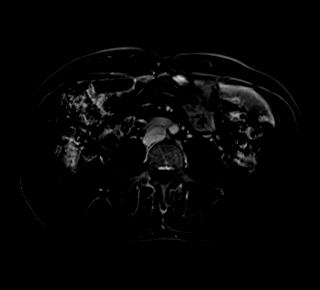
[im 40/80]
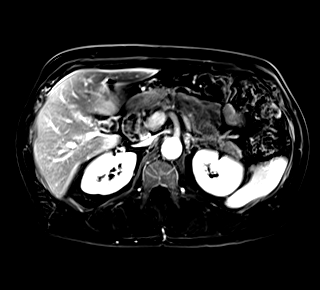
[im 80/80]
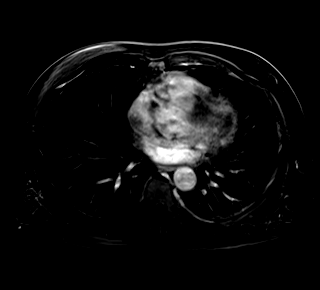

[48 of 48 positions shown; findings below may reference images not displayed]

FINDINGS: Lower chest: Incidental imaging of the lung bases is unremarkable by
MRI, limited assessment.

Hepatobiliary: Hepatic cysts similar to previous CT. Portal vein is
patent. No biliary duct dilation. Numerous gallstones fill the
gallbladder lumen. These are a cm in size and nearly fill a mildly
distended gallbladder, surrounded by sludge.

Undo that

Pancreas: Normal intrinsic T1 signal. No ductal dilation or sign of
inflammation. No focal lesion.

Spleen: Lesion which is mildly T2 hyperintense and isointense on T1
to the adjacent spleen shows no change in size at approximately 15 x
16 mm, perhaps even slightly smaller when compared to the prior
study where it measured approximately 18-19 mm. No additional
splenic lesions. Splenic contour is smooth. Lesion displays no
substantial early arterial phase enhancement showing progressive
enhancement which matches blood pool on later phases

Adrenals/Urinary Tract:  Normal adrenal glands.

Tiny cysts in the bilateral kidneys, single interpolar cyst on the
LEFT and on the RIGHT. No hydronephrosis. No perinephric stranding.

Stomach/Bowel: Gastrointestinal tract is unremarkable to the extent
evaluated.

Colonic diverticulosis, pancolonic diverticular disease.
Gastrointestinal tract otherwise unremarkable to the extent
evaluated on abdominal MRI.

Vascular/Lymphatic: No pathologically enlarged lymph nodes
identified. No abdominal aortic aneurysm demonstrated.

Other: Small RIGHT paramidline ventral hernia above the umbilicus
with a 2.6 cm defect similar to the prior exam, associated with
rectus diastasis of approximately 5 mm.

Musculoskeletal: No suspicious bone lesions identified.
IMPRESSION: Lesion in the spleen is similar in size, perhaps even slightly
smaller when compared to the prior study. Lesion most suggestive of
splenic hemangioma or splenic hamartoma particularly if there is no
history of malignancy. Consider six-month follow-up to assess for
stability.

Hepatic and renal cysts.

Cholelithiasis and gallbladder sludge.

Small RIGHT paramidline ventral hernia above the umbilicus with
rectus diastasis of approximately 5 mm.

## 2020-12-22 MED ORDER — GADOBENATE DIMEGLUMINE 529 MG/ML IV SOLN
19.0000 mL | Freq: Once | INTRAVENOUS | Status: AC | PRN
  Administered 2020-12-22: 19 mL via INTRAVENOUS

## 2020-12-23 ENCOUNTER — Other Ambulatory Visit (INDEPENDENT_AMBULATORY_CARE_PROVIDER_SITE_OTHER): Payer: Managed Care, Other (non HMO)

## 2020-12-23 DIAGNOSIS — Z125 Encounter for screening for malignant neoplasm of prostate: Secondary | ICD-10-CM | POA: Diagnosis not present

## 2020-12-23 DIAGNOSIS — E785 Hyperlipidemia, unspecified: Secondary | ICD-10-CM

## 2020-12-23 DIAGNOSIS — R7303 Prediabetes: Secondary | ICD-10-CM

## 2020-12-23 LAB — COMPREHENSIVE METABOLIC PANEL
ALT: 12 U/L (ref 0–53)
AST: 13 U/L (ref 0–37)
Albumin: 4.3 g/dL (ref 3.5–5.2)
Alkaline Phosphatase: 53 U/L (ref 39–117)
BUN: 15 mg/dL (ref 6–23)
CO2: 27 mEq/L (ref 19–32)
Calcium: 9.6 mg/dL (ref 8.4–10.5)
Chloride: 104 mEq/L (ref 96–112)
Creatinine, Ser: 0.86 mg/dL (ref 0.40–1.50)
GFR: 94.78 mL/min (ref >60.00)
Glucose, Bld: 102 mg/dL — ABNORMAL HIGH (ref 70–99)
Potassium: 4.4 mEq/L (ref 3.5–5.1)
Sodium: 138 mEq/L (ref 135–145)
Total Bilirubin: 0.4 mg/dL (ref 0.2–1.2)
Total Protein: 6.7 g/dL (ref 6.0–8.3)

## 2020-12-23 LAB — LIPID PANEL
Cholesterol: 174 mg/dL (ref 0–200)
HDL: 56 mg/dL (ref >39.00)
LDL Cholesterol: 102 mg/dL — ABNORMAL HIGH (ref 0–99)
NonHDL: 117.71
Total CHOL/HDL Ratio: 3
Triglycerides: 79 mg/dL (ref 0.0–149.0)
VLDL: 15.8 mg/dL (ref 0.0–40.0)

## 2020-12-23 LAB — PSA: PSA: 1.03 ng/mL (ref 0.10–4.00)

## 2020-12-23 LAB — HEMOGLOBIN A1C: Hgb A1c MFr Bld: 6.1 % (ref 4.6–6.5)

## 2020-12-28 ENCOUNTER — Other Ambulatory Visit: Payer: Self-pay

## 2020-12-28 ENCOUNTER — Encounter: Payer: Self-pay | Admitting: Family Medicine

## 2020-12-28 ENCOUNTER — Ambulatory Visit (INDEPENDENT_AMBULATORY_CARE_PROVIDER_SITE_OTHER): Payer: Managed Care, Other (non HMO) | Admitting: Family Medicine

## 2020-12-28 VITALS — BP 138/72 | HR 82 | Temp 98.1°F | Resp 16 | Ht 70.0 in | Wt 204.0 lb

## 2020-12-28 DIAGNOSIS — Z125 Encounter for screening for malignant neoplasm of prostate: Secondary | ICD-10-CM | POA: Diagnosis not present

## 2020-12-28 DIAGNOSIS — Z130 Encounter for screening for diseases of the blood and blood-forming organs and certain disorders involving the immune mechanism: Secondary | ICD-10-CM

## 2020-12-28 DIAGNOSIS — D7389 Other diseases of spleen: Secondary | ICD-10-CM

## 2020-12-28 DIAGNOSIS — Z0000 Encounter for general adult medical examination without abnormal findings: Secondary | ICD-10-CM

## 2020-12-28 DIAGNOSIS — Z131 Encounter for screening for diabetes mellitus: Secondary | ICD-10-CM

## 2020-12-28 DIAGNOSIS — Z23 Encounter for immunization: Secondary | ICD-10-CM | POA: Diagnosis not present

## 2020-12-28 DIAGNOSIS — E785 Hyperlipidemia, unspecified: Secondary | ICD-10-CM

## 2020-12-28 DIAGNOSIS — I10 Essential (primary) hypertension: Secondary | ICD-10-CM

## 2020-12-28 NOTE — Patient Instructions (Addendum)
I do recommend new booster. This can be done at your pharmacy or [facility] is now offering the bivalent Covid booster vaccine. To schedule an appointment or to find more information, visit [email or web address] You can also call [phone], Monday through Friday, 7 a.m. to 7 p.m.  Keep up walking for exercise or swimming. Add in resistance exercise as possible. Start low and go slow.   Recheck MRI in 6 months for spleen abnormality but let me know if there are questions sooner. Good luck with hernia surgery.   If any concerns on labs I will let you know. Thanks for coming in today.   Preventive Care 44-26 Years Old, Male Preventive care refers to lifestyle choices and visits with your health care provider that can promote health and wellness. This includes: A yearly physical exam. This is also called an annual wellness visit. Regular dental and eye exams. Immunizations. Screening for certain conditions. Healthy lifestyle choices, such as: Eating a healthy diet. Getting regular exercise. Not using drugs or products that contain nicotine and tobacco. Limiting alcohol use. What can I expect for my preventive care visit? Physical exam Your health care provider will check your: Height and weight. These may be used to calculate your BMI (body mass index). BMI is a measurement that tells if you are at a healthy weight. Heart rate and blood pressure. Body temperature. Skin for abnormal spots. Counseling Your health care provider may ask you questions about your: Past medical problems. Family's medical history. Alcohol, tobacco, and drug use. Emotional well-being. Home life and relationship well-being. Sexual activity. Diet, exercise, and sleep habits. Work and work Statistician. Access to firearms. What immunizations do I need? Vaccines are usually given at various ages, according to a schedule. Your health care provider will recommend vaccines for you based on your age, medical  history, and lifestyle or other factors, such as travel or where you work. What tests do I need? Blood tests Lipid and cholesterol levels. These may be checked every 5 years, or more often if you are over 59 years old. Hepatitis C test. Hepatitis B test. Screening Lung cancer screening. You may have this screening every year starting at age 59 if you have a 30-pack-year history of smoking and currently smoke or have quit within the past 15 years. Prostate cancer screening. Recommendations will vary depending on your family history and other risks. Genital exam to check for testicular cancer or hernias. Colorectal cancer screening. All adults should have this screening starting at age 59 and continuing until age 31. Your health care provider may recommend screening at age 55 if you are at increased risk. You will have tests every 1-10 years, depending on your results and the type of screening test. Diabetes screening. This is done by checking your blood sugar (glucose) after you have not eaten for a while (fasting). You may have this done every 1-3 years. STD (sexually transmitted disease) testing, if you are at risk. Follow these instructions at home: Eating and drinking  Eat a diet that includes fresh fruits and vegetables, whole grains, lean protein, and low-fat dairy products. Take vitamin and mineral supplements as recommended by your health care provider. Do not drink alcohol if your health care provider tells you not to drink. If you drink alcohol: Limit how much you have to 0-2 drinks a day. Be aware of how much alcohol is in your drink. In the [place] one drink equals one 12 oz bottle of beer (355 mL), one 5 oz glass  of wine (148 mL), or one 1 oz glass of hard liquor (44 mL). Lifestyle Take daily care of your teeth and gums. Brush your teeth every morning and night with fluoride toothpaste. Floss one time each day. Stay active. Exercise for at least 30 minutes 5 or more days each  week. Do not use any products that contain nicotine or tobacco, such as cigarettes, e-cigarettes, and chewing tobacco. If you need help quitting, ask your health care provider. Do not use drugs. If you are sexually active, practice safe sex. Use a condom or other form of protection to prevent STIs (sexually transmitted infections). If told by your health care provider, take low-dose aspirin daily starting at age 59. Find healthy ways to cope with stress, such as: Meditation, yoga, or listening to music. Journaling. Talking to a trusted person. Spending time with friends and family. Safety Always wear your seat belt while driving or riding in a vehicle. Do not drive: If you have been drinking alcohol. Do not ride with someone who has been drinking. When you are tired or distracted. While texting. Wear a helmet and other protective equipment during sports activities. If you have firearms in your house, make sure you follow all gun safety procedures. What's next? Go to your health care provider once a year for an annual wellness visit. Ask your health care provider how often you should have your eyes and teeth checked. Stay up to date on all vaccines. This information is not intended to replace advice given to you by your health care provider. Make sure you discuss any questions you have with your health care provider. Document Revised: 05/22/2020 Document Reviewed: 03/08/2018 Elsevier Patient Education  2022 [employer].

## 2020-12-28 NOTE — Progress Notes (Signed)
Subjective:  Patient ID: Patrick Willis, male    DOB: April 23, 1960  Age: 60 y.o. MRN: [id number]  CC:  Chief Complaint  Patient presents with   Annual Exam    Pt has hernia repair scheduled coming up no action required just FYI, nothing to discuss feels well.     HPI Patrick Willis presents for   Annual physical exam.  No recent health changes.   Plan for upcoming ventral hernia repair.  Dr. Harlow Asa, [facility] surgery. Plan in December.   Splenic lesion: noted on CT, then further eval by MRI 12/22/20.    IMPRESSION: Lesion in the spleen is similar in size, perhaps even slightly smaller when compared to the prior study. 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 Consider six-month follow-up to assess for stability.    BP Readings from Last 3 Encounters:  12/28/20 138/72  11/22/19 124/90  10/28/19 128/83  Home readings - 120/85 when giving blood.    Cancer Screening: Colonoscopy 01/05/2012 - repeat in 10 years.  Prostate: no FH of prostate CA.  The natural history of prostate cancer and ongoing controversy regarding screening and potential treatment outcomes of prostate cancer has been discussed with the patient. The meaning of a false positive PSA and a false negative PSA has been discussed. He indicates understanding of the limitations of this screening test and wishes to proceed with screening PSA testing and DRE. Lab Results  Component Value Date   PSA1 0.8 10/24/2019   PSA1 0.8 11/20/2018   PSA1 0.8 10/24/2017   PSA 1.03 12/23/2020   PSA 0.91 09/03/2015   PSA 0.80 06/02/2014  Has dermatologist with routine screening.   Immunization History  Administered Date(s) Administered   Influenza,inj,Quad PF,6+ Mos 11/21/2018   Influenza-Unspecified 01/28/2018   Tdap 10/24/2017   Zoster Recombinat (Shingrix) 10/24/2017, 01/28/2018  Flu vaccine today.  Covid vaccine - initial vaccine and booster.  Infection in July. Prior booster late last year. Recommended Bivalent when he is ready.   Depression screen [facility] 2/9 12/28/2020 10/28/2019 11/21/2018 02/14/2018 11/21/2017  Decreased Interest 0 0 0 0 0  Down, Depressed, Hopeless 0 0 0 0 0  PHQ - 2 Score 0 0 0 0 0   Vision Screening   Right eye Left eye Both eyes  Without correction     With correction 20/10 20/10 20/10   Optho - annual visit. No other eye disorder. No glaucoma, no gtts.   Dental: every 6 months.   Alcohol: 4-5 drinks per week. Beer.   Tobacco: none  Exercise: 10k step per day walking.   History Patient Active Problem List   Diagnosis Date Noted   Elevated systolic blood pressure reading without diagnosis of hypertension 11/25/2019   Sinus bradycardia by electrocardiogram 11/22/2019   First degree AV block 11/22/2019   Past Medical History:  Diagnosis Date   Hypertension    Phreesia 10/28/2019   Sinus bradycardia on ECG    Rates in 40s and 50s at rest-with 1 AVB   Past Surgical History:  Procedure Laterality Date   HERNIA REPAIR     No Known Allergies Prior to Admission medications   Medication Sig Start Date End Date Taking? Authorizing Provider  Multiple Vitamins-Minerals (MULTIVITAMIN WITH MINERALS) tablet Take 1 tablet by mouth daily.   Yes [provider]   Social History   Socioeconomic History   Marital status: Married    Spouse name: Not on file   Number  of children: 3   Years of education: Not on file   Highest education level: Not on file  Occupational History   Occupation: SALES REP  Tobacco Use   Smoking status: Never   Smokeless tobacco: Never  Vaping Use   Vaping Use: Never used  Substance and Sexual Activity   Alcohol use: Yes    Alcohol/week: 4.0 - 5.0 standard drinks    Types: 4 - 5 Cans of beer per week   Drug use: No   Sexual activity: Yes  Other Topics Concern   Not on file  Social History Narrative   Exercise walking x 2 miles four times/wk      He and his  family just returned from a trip to [place].  His twin daughters have been taking part in a high school summer abroad program in [place].  Their trip started in [place] and encompass much the [place] part of [place] including [place] and [place]   Social Determinants of Health   Financial Resource Strain: Not on [employer] Insecurity: Not on file  Transportation Needs: Not on file  Physical Activity: Not on file  Stress: Not on file  Social Connections: Not on file  Intimate Partner Violence: Not on file    Review of Systems 13 point review of systems per patient health survey noted.  Negative other than as indicated above or in HPI.    Objective:   Vitals:   12/28/20 1410  BP: 138/72  Pulse: 82  Resp: 16  Temp: 98.1 F (36.7 C)  TempSrc: Temporal  SpO2: 97%  Weight: 204 lb (92.5 kg)  Height: 5\' 10"  (1.778 m)    Physical Exam Vitals reviewed.  Constitutional:      Appearance: He is well-developed.  HENT:     Head: Normocephalic and atraumatic.     Right Ear: External ear normal.     Left Ear: External ear normal.  Eyes:     Conjunctiva/sclera: Conjunctivae normal.     Pupils: Pupils are equal, round, and reactive to light.  Neck:     Thyroid: No thyromegaly.     Vascular: No carotid bruit or JVD.  Cardiovascular:     Rate and Rhythm: Normal rate and regular rhythm.     Heart sounds: Normal heart sounds. No murmur heard. Pulmonary:     Effort: Pulmonary effort is normal. No respiratory distress.     Breath sounds: Normal breath sounds. No wheezing or rales.  Abdominal:     General: There is no distension.     Palpations: Abdomen is soft.     Tenderness: There is no abdominal tenderness.  Genitourinary:    Prostate: Normal.  Musculoskeletal:        General: No tenderness. Normal range of motion.     Cervical back: Normal range of motion and neck supple.     Right lower leg: No edema.     Left lower leg: No edema.  Lymphadenopathy:     Cervical: No cervical  adenopathy.  Skin:    General: Skin is warm and dry.  Neurological:     Mental Status: He is alert and oriented to person, place, and time.     Deep Tendon Reflexes: Reflexes are normal and symmetric.  Psychiatric:        Mood and Affect: Mood normal.        Behavior: Behavior normal.       Assessment & Plan:  Patrick Willis is a 60 y.o. male .  Annual physical exam - Plan: CBC with Differential/Platelet, Comprehensive metabolic panel, Lipid panel, PSA, Hemoglobin A1c  - -anticipatory guidance as below in AVS, screening labs above. Health maintenance items as above in HPI discussed/recommended as applicable.   Hyperlipidemia, unspecified hyperlipidemia type - Plan: Comprehensive metabolic panel, Lipid panel  -Check labs, lifestyle modification/activity/exercise approach as only mild elevated LDL previously.  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  Patient Instructions  I do recommend new booster. This can be done at your pharmacy or [facility] is now offering the bivalent Covid booster vaccine. To schedule an appointment or to find more information, visit [email or web address] You can also call [phone], Monday through Friday, 7 a.m. to 7 p.m.  Keep up walking for exercise or swimming. Add in resistance exercise as possible. Start low and go slow.   Recheck MRI in 6 months for spleen abnormality but let me know if there are questions sooner. Good luck with hernia surgery.   If any concerns on labs I will let you  know. Thanks for coming in today.   Preventive Care 33-28 Years Old, Male Preventive care refers to lifestyle choices and visits with your health care provider that can promote health and wellness. This includes: A yearly physical exam. This is also called an annual wellness visit. Regular dental and eye exams. Immunizations. Screening for certain conditions. Healthy lifestyle choices, such as: Eating a healthy diet. Getting regular exercise. Not using drugs or products that contain nicotine and tobacco. Limiting alcohol use. What can I expect for my preventive care visit? Physical exam Your health care provider will check your: Height and weight. These may be used to calculate your BMI (body mass index). BMI is a measurement that tells if you are at a healthy weight. Heart rate and blood pressure. Body temperature. Skin for abnormal spots. Counseling Your health care provider may ask you questions about your: Past medical problems. Family's medical history. Alcohol, tobacco, and drug use. Emotional well-being. Home life and relationship well-being. Sexual activity. Diet, exercise, and sleep habits. Work and work Statistician. Access to firearms. What immunizations do I need? Vaccines are usually given at various ages, according to a schedule. Your health care provider will recommend vaccines for you based on your age, medical history, and lifestyle or other factors, such as travel or where you work. What tests do I need? Blood tests Lipid and cholesterol levels. These may be checked every 5 years, or more often if you are over 61 years old. Hepatitis C test. Hepatitis B test. Screening Lung cancer screening. You may have this screening every year starting at age 68 if you have a 30-pack-year history of smoking and currently smoke or have quit within the past 15 years. Prostate cancer screening. Recommendations will vary depending on your family history and other  risks. Genital exam to check for testicular cancer or hernias. Colorectal cancer screening. All adults should have this screening starting at age 68 and continuing until age 68. Your health care provider may recommend screening at age 23 if you  are at increased risk. You will have tests every 1-10 years, depending on your results and the type of screening test. Diabetes screening. This is done by checking your blood sugar (glucose) after you have not eaten for a while (fasting). You may have this done every 1-3 years. STD (sexually transmitted disease) testing, if you are at risk. Follow these instructions at home: Eating and drinking  Eat a diet that includes fresh fruits and vegetables, whole grains, lean protein, and low-fat dairy products. Take vitamin and mineral supplements as recommended by your health care provider. Do not drink alcohol if your health care provider tells you not to drink. If you drink alcohol: Limit how much you have to 0-2 drinks a day. Be aware of how much alcohol is in your drink. In the [place] one drink equals one 12 oz bottle of beer (355 mL), one 5 oz glass of wine (148 mL), or one 1 oz glass of hard liquor (44 mL). Lifestyle Take daily care of your teeth and gums. Brush your teeth every morning and night with fluoride toothpaste. Floss one time each day. Stay active. Exercise for at least 30 minutes 5 or more days each week. Do not use any products that contain nicotine or tobacco, such as cigarettes, e-cigarettes, and chewing tobacco. If you need help quitting, ask your health care provider. Do not use drugs. If you are sexually active, practice safe sex. Use a condom or other form of protection to prevent STIs (sexually transmitted infections). If told by your health care provider, take low-dose aspirin daily starting at age 37. Find healthy ways to cope with stress, such as: Meditation, yoga, or listening to music. Journaling. Talking to a trusted  person. Spending time with friends and family. Safety Always wear your seat belt while driving or riding in a vehicle. Do not drive: If you have been drinking alcohol. Do not ride with someone who has been drinking. When you are tired or distracted. While texting. Wear a helmet and other protective equipment during sports activities. If you have firearms in your house, make sure you follow all gun safety procedures. What's next? Go to your health care provider once a year for an annual wellness visit. Ask your health care provider how often you should have your eyes and teeth checked. Stay up to date on all vaccines. This information is not intended to replace advice given to you by your health care provider. Make sure you discuss any questions you have with your health care provider. Document Revised: 05/22/2020 Document Reviewed: 03/08/2018 Elsevier Patient Education  2022 [place],   Merri Ray, MD [place] 12/28/20 3:05 PM

## 2021-01-01 NOTE — Progress Notes (Signed)
Patrick Willis,  I reviewed your MRI scan from 12/22/2020.  Splenic lesion is stable or maybe even slightly smaller.  Appears benign.  Ventral incisional hernia is stable and contains fatty tissue, no bowel.  Please contact [facility] if you'd like to be seen in the office soon and schedule repair of the ventral hernia by the end of the year.  Otherwise, you can continue to monitor it for any changes or enlargment.  [place], MD [facility] [place] practice Office: [phone]

## 2021-01-28 ENCOUNTER — Ambulatory Visit: Payer: Self-pay | Admitting: Surgery

## 2021-01-28 DIAGNOSIS — R161 Splenomegaly, not elsewhere classified: Secondary | ICD-10-CM | POA: Insufficient documentation

## 2021-02-15 NOTE — Patient Instructions (Addendum)
DUE TO COVID-19 ONLY ONE VISITOR IS ALLOWED TO COME WITH YOU AND STAY IN THE WAITING ROOM ONLY DURING PRE OP AND PROCEDURE.   **NO VISITORS ARE ALLOWED IN THE SHORT STAY AREA OR RECOVERY ROOM!!**  IF YOU WILL BE ADMITTED INTO THE HOSPITAL YOU ARE ALLOWED ONLY TWO SUPPORT PEOPLE DURING VISITATION HOURS ONLY (7 AM -8PM)    Up to two visitors ages 54+ are allowed at one time in a patient's room.  The visitors may rotate out with other people throughout the day.  Additionally, up to two children between the ages of 24 and 70 are allowed and do not count toward the number of allowed visitors.  Children within this age range must be accompanied by an adult visitor.  One adult visitor may remain with the patient overnight and must be in the room by 8 PM.  COVID SWAB TESTING MUST BE COMPLETED ON:  Wednesday 03-10-21, Between the hours of 8 and 3  **MUST PRESENT COMPLETED FORM AT TESTING SITE**    [place] (backside of the building)  You are not required to quarantine, however you are required to wear a well-fitted mask when you are out and around people not in your household.  Hand Hygiene often Do NOT share personal items Notify your provider if you are in close contact with someone who has COVID or you develop fever 100.4 or greater, new onset of sneezing, cough, sore throat, shortness of breath or body aches.        Your procedure is scheduled on:  Friday, 03-12-21   Report to [facility] Main  Entrance     Report to admitting at 5:15 AM   Call this number if you have problems the morning of surgery [phone]   Do not eat food :After Midnight.   May have liquids until 4:30 AM day of surgery  CLEAR LIQUID DIET  Foods Allowed                                                                     Foods Excluded  Water, Black Coffee (no milk/no creamer) and tea, regular and decaf                              liquids that you cannot  Plain Jell-O in any  flavor  (No red)                         see through such as: Fruit ices (not with fruit pulp)                                 milk, soups, orange juice  Iced Popsicles (No red)                                    All solid food                             Apple juices  Sports drinks like Gatorade (No red) Lightly seasoned clear broth or consume(fat free) Sugar   Oral Hygiene is also important to reduce your risk of infection.                                    Remember - BRUSH YOUR TEETH THE MORNING OF SURGERY WITH YOUR REGULAR TOOTHPASTE   Do NOT smoke after Midnight   Take these medicines the morning of surgery with A SIP OF WATER:  None   Stop all vitamins and herbal supplements a week before surgery             You may not have any metal on your body including  jewelry, and body piercing             Do not wear lotions, powders, cologne, or deodorant              Men may shave face and neck.  Do not bring valuables to the hospital. [place].   Contacts, dentures or bridgework may not be worn into surgery.   Bring small overnight bag day of surgery.   Special Instructions: Bring a copy of your healthcare power of attorney and living will documents the day of surgery if you haven't scanned them in before.  Please read over the following fact sheets you were given: IF YOU HAVE QUESTIONS ABOUT YOUR PRE OP INSTRUCTIONS PLEASE CALL [facility] - Preparing for Surgery Before surgery, you can play an important role.  Because skin is not sterile, your skin needs to be as free of germs as possible.  You can reduce the number of germs on your skin by washing with CHG (chlorahexidine gluconate) soap before surgery.  CHG is an antiseptic cleaner which kills germs and bonds with the skin to continue killing germs even after washing. Please DO NOT use if you have an allergy to CHG or antibacterial soaps.  If your skin becomes  reddened/irritated stop using the CHG and inform your nurse when you arrive at Short Stay. Do not shave (including legs and underarms) for at least 48 hours prior to the first CHG shower.  You may shave your face/neck.  Please follow these instructions carefully:  1.  Shower with CHG Soap the night before surgery and the  morning of surgery.  2.  If you choose to wash your hair, wash your hair first as usual with your normal  shampoo.  3.  After you shampoo, rinse your hair and body thoroughly to remove the shampoo.                             4.  Use CHG as you would any other liquid soap.  You can apply chg directly to the skin and wash.  Gently with a scrungie or clean washcloth.  5.  Apply the CHG Soap to your body ONLY FROM THE NECK DOWN.   Do   not use on face/ open                           Wound or open sores. Avoid contact with eyes, ears mouth and   genitals (private parts).  Wash face,  Genitals (private parts) with your normal soap.             6.  Wash thoroughly, paying special attention to the area where your    surgery  will be performed.  7.  Thoroughly rinse your body with warm water from the neck down.  8.  DO NOT shower/wash with your normal soap after using and rinsing off the CHG Soap.                9.  Pat yourself dry with a clean towel.            10.  Wear clean pajamas.            11.  Place clean sheets on your bed the night of your first shower and do not  sleep with pets. Day of Surgery : Do not apply any lotions/deodorants the morning of surgery.  Please wear clean clothes to the hospital/surgery center.  FAILURE TO FOLLOW THESE INSTRUCTIONS MAY RESULT IN THE CANCELLATION OF YOUR SURGERY  PATIENT SIGNATURE_________________________________  NURSE SIGNATURE__________________________________  ________________________________________________________________________

## 2021-02-15 NOTE — Progress Notes (Addendum)
COVID swab appointment: 03-10-21  COVID Vaccine Completed:  Yes x2 Date COVID Vaccine completed: Has received booster:  Yes x1 COVID vaccine manufacturer: Moderna     Date of COVID positive in last 90 days:  No  PCP - Merri Ray, MD Cardiologist - Glenetta Hew, MD  Chest x-ray - N/A EKG - 02-16-21 Epic Stress Test - N/A ECHO - N/A Cardiac Cath - N/A Pacemaker/ICD device last checked: Spinal Cord Stimulator: Long Term Monitor - 12-04-19 Epic  Sleep Study - Yes, neg sleep apnea CPAP -   Fasting Blood Sugar - N/A Checks Blood Sugar _____ times a day  Blood Thinner Instructions:  N/A Aspirin Instructions: Last Dose:  Activity level:  Can go up a flight of stairs and perform activities of daily living without stopping and without symptoms of chest pain or shortness of breath.  Able to exercise without symptoms   Anesthesia review: Bradycardia, HTN  BP elevated at PAT 145/92 and on recheck 139/97.  Patient denies chest pain, headache and SOB.  Patient states that he has never been on BP meds, just monitors.  He will continue to monitor and notify PCP if remains elevated.  Patient denies shortness of breath, fever, cough and chest pain at PAT appointment   Patient verbalized understanding of instructions that were given to them at the PAT appointment. Patient was also instructed that they will need to review over the PAT instructions again at home before surgery.

## 2021-02-16 ENCOUNTER — Encounter (HOSPITAL_COMMUNITY)
Admission: RE | Admit: 2021-02-16 | Discharge: 2021-02-16 | Disposition: A | Discharge status: Home / Self Care | Payer: Managed Care, Other (non HMO) | Source: Ambulatory Visit | Attending: Surgery | Admitting: Surgery

## 2021-02-16 ENCOUNTER — Encounter (HOSPITAL_COMMUNITY): Payer: Self-pay

## 2021-02-16 ENCOUNTER — Other Ambulatory Visit: Payer: Self-pay

## 2021-02-16 VITALS — BP 139/97 | HR 60 | Temp 98.2°F | Resp 18 | Ht 70.0 in | Wt 203.6 lb

## 2021-02-16 DIAGNOSIS — I440 Atrioventricular block, first degree: Secondary | ICD-10-CM | POA: Insufficient documentation

## 2021-02-16 DIAGNOSIS — I2510 Atherosclerotic heart disease of native coronary artery without angina pectoris: Secondary | ICD-10-CM

## 2021-02-16 DIAGNOSIS — Z01818 Encounter for other preprocedural examination: Secondary | ICD-10-CM | POA: Insufficient documentation

## 2021-02-16 LAB — BASIC METABOLIC PANEL
Anion gap: 7 (ref 5–15)
BUN: 17 mg/dL (ref 6–20)
CO2: 24 mmol/L (ref 22–32)
Calcium: 8.9 mg/dL (ref 8.9–10.3)
Chloride: 107 mmol/L (ref 98–111)
Creatinine, Ser: 0.7 mg/dL (ref 0.61–1.24)
GFR, Estimated: >60 mL/min (ref >60)
Glucose, Bld: 89 mg/dL (ref 70–99)
Potassium: 3.7 mmol/L (ref 3.5–5.1)
Sodium: 138 mmol/L (ref 135–145)

## 2021-02-16 LAB — CBC
HCT: 35.9 % — ABNORMAL LOW (ref 39.0–52.0)
Hemoglobin: 10.7 g/dL — ABNORMAL LOW (ref 13.0–17.0)
MCH: 24.1 pg — ABNORMAL LOW (ref 26.0–34.0)
MCHC: 29.8 g/dL — ABNORMAL LOW (ref 30.0–36.0)
MCV: 80.9 fL (ref 80.0–100.0)
Platelets: 305 10*3/uL (ref 150–400)
RBC: 4.44 MIL/uL (ref 4.22–5.81)
RDW: 16.3 % — ABNORMAL HIGH (ref 11.5–15.5)
WBC: 7.3 10*3/uL (ref 4.0–10.5)
nRBC: 0 % (ref 0.0–0.2)

## 2021-02-17 ENCOUNTER — Encounter: Payer: Self-pay | Admitting: Family Medicine

## 2021-03-01 ENCOUNTER — Encounter (HOSPITAL_COMMUNITY): Payer: Self-pay | Admitting: Surgery

## 2021-03-01 NOTE — H&P (Signed)
PROVIDER: Maison Kestenbaum Charlotta Newton, MD  Chief Complaint: Follow-up (Ventral incisional hernia)   History of Present Illness:  Patient returns to my practice for follow-up having been evaluated on May 28, 2020 for ventral incisional hernia. Patient has a complex past surgical history with initial repair of his ventral hernia in [place] in 2001. This repair failed and he underwent repair of the incisional hernia in 2009 in [place]. This repair has also failed with recurrent ventral incisional hernia. Patient underwent bilateral inguinal hernia repair by my partner in 2017. A CT scan of the abdomen and pelvis was obtained on June 19, 2020. This demonstrated a paramedian epigastric ventral hernia containing omental fat. There was no involvement with the bowel. Incidental finding was made of a 2.1 cm indeterminate low-attenuation lesion in the spleen. Follow-up imaging was recommended in 6 months. MRI scan was performed on December 22, 2020. 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 Follow-up MRI scan was recommended in 6 months. Also noted was the paramidline ventral hernia. Patient now returns to discuss ventral hernia repair.   Review of Systems: A complete review of systems was obtained from the patient. I have reviewed this information and discussed as appropriate with the patient. See HPI as well for other ROS.  Review of Systems  Constitutional: Negative.  HENT: Negative.  Eyes: Negative.  Respiratory: Negative.  Cardiovascular: Negative.  Gastrointestinal: Positive for abdominal pain.  Genitourinary: Negative.  Musculoskeletal: Negative.  Skin: Negative.  Neurological: Negative.  Endo/Heme/Allergies: Negative.  Psychiatric/Behavioral: Negative.    Medical History: History reviewed. No pertinent past medical history.  Patient Active Problem List  Diagnosis   Elevated systolic  blood pressure reading without diagnosis of hypertension   First degree AV block   Sinus bradycardia by electrocardiogram   Recurrent ventral incisional hernia   Splenic mass   Past Surgical History:  Procedure Laterality Date   HERNIA REPAIR    No Known Allergies  No current outpatient medications on file prior to visit.   No current facility-administered medications on file prior to visit.   Family History  Problem Relation Age of Onset   Stroke Sister   Diabetes Brother    Social History   Tobacco Use  Smoking Status Never Smoker  Smokeless Tobacco Never Used    Social History   Socioeconomic History   Marital status: Unknown  Tobacco Use   Smoking status: Never Smoker   Smokeless tobacco: Never Used  Substance and Sexual Activity   Alcohol use: Yes   Drug use: Never   Objective:   Vitals:  BP: 120/80  Pulse: 55  Temp: 36.7 C (98 F)  SpO2: 99%  Weight: 92.6 kg (204 lb 3.2 oz)  Height: 177.8 cm (5\' 10" )   Body mass index is 29.3 kg/m.  Physical Exam   GENERAL APPEARANCE Development: normal Nutritional status: normal Gross deformities: none  SKIN Rash, lesions, ulcers: none Induration, erythema: none Nodules: none palpable  EYES Conjunctiva and lids: normal Pupils: equal and reactive Iris: normal bilaterally  EARS, NOSE, MOUTH, THROAT External ears: no lesion or deformity External nose: no lesion or deformity Hearing: grossly normal Due to Covid-19 pandemic, patient is wearing a mask.  ABDOMEN Distension: none Masses: none palpable Tenderness: none Hepatosplenomegaly: not present Hernia: There is a hernia defect beneath the umbilicus measuring approximately 1.5 cm in diameter. This is easily reducible. There is an incarcerated bulge to the  right of midline above the level of the umbilicus likely containing adipose tissue. This is not reducible. It is mildly tender to manipulation.  MUSCULOSKELETAL Station and gait: normal Digits  and nails: no clubbing or cyanosis Muscle strength: grossly normal all extremities Range of motion: grossly normal all extremities Deformity: none  PSYCHIATRIC Oriented to person, place, and time: yes Mood and affect: normal for situation Judgment and insight: appropriate for situation  Assessment and Plan:   Recurrent ventral incisional hernia  Splenic mass   Today we reviewed the CT scan from March as well as the MRI scan from September. The splenic lesion appears to be a benign finding and has appeared to decrease in size over the 95-month interval. After discussion, we will plan to repeat an MRI scan in 1 year.  Patient would like to proceed with repair of the ventral incisional hernia. We will make arrangements for surgery in December 2022 at a time convenient for the patient. Today we discussed performing this as a laparoscopic surgery using mesh to reinforce the repair. We discussed the hospital stay to be anticipated. We discussed his postoperative recovery and restrictions on his activities. We discussed wearing an abdominal binder. The patient understands and wishes to proceed.  The risks and benefits of the procedure have been discussed at length with the patient. The patient understands the proposed procedure, potential alternative treatments, and the course of recovery to be expected. All of the patient's questions have been answered at this time. The patient wishes to proceed with surgery.   Armandina Gemma, MD [facility] A [place] practice Office: [phone]

## 2021-03-07 DIAGNOSIS — K430 Incisional hernia with obstruction, without gangrene: Secondary | ICD-10-CM | POA: Diagnosis present

## 2021-03-10 ENCOUNTER — Other Ambulatory Visit: Payer: Self-pay | Admitting: Surgery

## 2021-03-11 LAB — SARS CORONAVIRUS 2 (TAT 6-24 HRS): SARS Coronavirus 2: NEGATIVE

## 2021-03-11 NOTE — Anesthesia Preprocedure Evaluation (Addendum)
Anesthesia Evaluation  Patient identified by MRN, date of birth, ID band Patient awake    Reviewed: Allergy & Precautions, NPO status , Patient's Chart, lab work & pertinent test results  Airway Mallampati: II  TM Distance: >3 FB Neck ROM: Full    Dental no notable dental hx.    Pulmonary neg pulmonary ROS,    Pulmonary exam normal breath sounds clear to auscultation       Cardiovascular Exercise Tolerance: Good hypertension, + dysrhythmias (1st degree AV block)  Rhythm:Regular Rate:Bradycardia  Sinus bradycardia with 1st degree A-V block Otherwise normal ECG No previous tracing Confirmed by Dorris Carnes [phone]) on 02/16/2021 10:35:09 PM   Neuro/Psych negative neurological ROS  negative psych ROS   GI/Hepatic negative GI ROS, Neg liver ROS,   Endo/Other  negative endocrine ROS  Renal/GU negative Renal ROS  negative genitourinary   Musculoskeletal negative musculoskeletal ROS (+)   Abdominal   Peds  Hematology negative hematology ROS (+)   Anesthesia Other Findings   Reproductive/Obstetrics negative OB ROS                            Anesthesia Physical Anesthesia Plan  ASA: 2  Anesthesia Plan: General   Post-op Pain Management: Tylenol PO (pre-op)   Induction: Intravenous  PONV Risk Score and Plan: 2 and Treatment may vary due to age or medical condition  Airway Management Planned: Oral ETT  Additional Equipment: None  Intra-op Plan:   Post-operative Plan: Extubation in OR  Informed Consent: I have reviewed the patients History and Physical, chart, labs and discussed the procedure including the risks, benefits and alternatives for the proposed anesthesia with the patient or authorized representative who has indicated his/her understanding and acceptance.     Dental advisory given  Plan Discussed with: Anesthesiologist and CRNA  Anesthesia Plan Comments:         Anesthesia Quick Evaluation

## 2021-03-12 ENCOUNTER — Ambulatory Visit (HOSPITAL_COMMUNITY): Payer: Managed Care, Other (non HMO) | Admitting: Physician Assistant

## 2021-03-12 ENCOUNTER — Encounter (HOSPITAL_COMMUNITY)
Admission: RE | Disposition: A | Discharge status: Home / Self Care | Payer: Self-pay | Source: Home / Self Care | Attending: Surgery

## 2021-03-12 ENCOUNTER — Ambulatory Visit (HOSPITAL_COMMUNITY): Payer: Managed Care, Other (non HMO) | Admitting: Certified Registered"

## 2021-03-12 ENCOUNTER — Encounter (HOSPITAL_COMMUNITY): Payer: Self-pay | Admitting: Surgery

## 2021-03-12 ENCOUNTER — Other Ambulatory Visit: Payer: Self-pay

## 2021-03-12 ENCOUNTER — Inpatient Hospital Stay (HOSPITAL_COMMUNITY)
Admission: RE | Admit: 2021-03-12 | Discharge: 2021-03-14 | DRG: 355 | Disposition: A | Discharge status: Home / Self Care | Payer: Managed Care, Other (non HMO) | Attending: Surgery | Admitting: Surgery

## 2021-03-12 DIAGNOSIS — R001 Bradycardia, unspecified: Secondary | ICD-10-CM | POA: Diagnosis present

## 2021-03-12 DIAGNOSIS — K430 Incisional hernia with obstruction, without gangrene: Secondary | ICD-10-CM | POA: Diagnosis present

## 2021-03-12 DIAGNOSIS — K660 Peritoneal adhesions (postprocedural) (postinfection): Secondary | ICD-10-CM | POA: Diagnosis present

## 2021-03-12 DIAGNOSIS — Z833 Family history of diabetes mellitus: Secondary | ICD-10-CM

## 2021-03-12 DIAGNOSIS — Z20822 Contact with and (suspected) exposure to covid-19: Secondary | ICD-10-CM | POA: Diagnosis present

## 2021-03-12 DIAGNOSIS — D7389 Other diseases of spleen: Secondary | ICD-10-CM | POA: Diagnosis present

## 2021-03-12 DIAGNOSIS — Z823 Family history of stroke: Secondary | ICD-10-CM

## 2021-03-12 DIAGNOSIS — I440 Atrioventricular block, first degree: Secondary | ICD-10-CM | POA: Diagnosis present

## 2021-03-12 DIAGNOSIS — K420 Umbilical hernia with obstruction, without gangrene: Secondary | ICD-10-CM | POA: Diagnosis present

## 2021-03-12 DIAGNOSIS — K432 Incisional hernia without obstruction or gangrene: Secondary | ICD-10-CM | POA: Diagnosis present

## 2021-03-12 HISTORY — PX: INCISIONAL HERNIA REPAIR: SHX193

## 2021-03-12 HISTORY — PX: INSERTION OF MESH: SHX5868

## 2021-03-12 SURGERY — REPAIR, HERNIA, INCISIONAL, LAPAROSCOPIC
Anesthesia: General | Site: Abdomen

## 2021-03-12 MED ORDER — ONDANSETRON HCL 4 MG/2ML IJ SOLN
4.0000 mg | Freq: Four times a day (QID) | INTRAMUSCULAR | Status: DC | PRN
  Administered 2021-03-12: 4 mg via INTRAVENOUS
  Filled 2021-03-12: qty 2

## 2021-03-12 MED ORDER — FENTANYL CITRATE (PF) 100 MCG/2ML IJ SOLN
INTRAMUSCULAR | Status: AC
  Filled 2021-03-12: qty 2

## 2021-03-12 MED ORDER — ACETAMINOPHEN 500 MG PO TABS
1000.0000 mg | ORAL_TABLET | Freq: Once | ORAL | Status: AC
  Administered 2021-03-12: 1000 mg via ORAL
  Filled 2021-03-12: qty 2

## 2021-03-12 MED ORDER — 0.9 % SODIUM CHLORIDE (POUR BTL) OPTIME
TOPICAL | Status: DC | PRN
  Administered 2021-03-12: 500 mL

## 2021-03-12 MED ORDER — LABETALOL HCL 5 MG/ML IV SOLN
INTRAVENOUS | Status: DC | PRN
  Administered 2021-03-12: 2.5 mg via INTRAVENOUS

## 2021-03-12 MED ORDER — EPHEDRINE 5 MG/ML INJ
INTRAVENOUS | Status: AC
  Filled 2021-03-12: qty 5

## 2021-03-12 MED ORDER — PROPOFOL 10 MG/ML IV BOLUS
INTRAVENOUS | Status: DC | PRN
  Administered 2021-03-12: 200 mg via INTRAVENOUS

## 2021-03-12 MED ORDER — AMISULPRIDE (ANTIEMETIC) 5 MG/2ML IV SOLN: 10.0000 mg | Freq: Once | INTRAVENOUS | Status: DC | PRN

## 2021-03-12 MED ORDER — CEFAZOLIN SODIUM-DEXTROSE 2-4 GM/100ML-% IV SOLN
2.0000 g | INTRAVENOUS | Status: AC
  Administered 2021-03-12: 2 g via INTRAVENOUS
  Filled 2021-03-12: qty 100

## 2021-03-12 MED ORDER — BUPIVACAINE-EPINEPHRINE 0.25% -1:200000 IJ SOLN
INTRAMUSCULAR | Status: DC | PRN
  Administered 2021-03-12: 40 mL

## 2021-03-12 MED ORDER — OXYCODONE HCL 5 MG/5ML PO SOLN: 5.0000 mg | Freq: Once | ORAL | Status: DC | PRN

## 2021-03-12 MED ORDER — LIDOCAINE HCL (CARDIAC) PF 100 MG/5ML IV SOSY
PREFILLED_SYRINGE | INTRAVENOUS | Status: DC | PRN
  Administered 2021-03-12: 60 mg via INTRAVENOUS

## 2021-03-12 MED ORDER — ROCURONIUM BROMIDE 10 MG/ML (PF) SYRINGE
PREFILLED_SYRINGE | INTRAVENOUS | Status: AC
  Filled 2021-03-12: qty 10

## 2021-03-12 MED ORDER — LABETALOL HCL 5 MG/ML IV SOLN
INTRAVENOUS | Status: AC
  Filled 2021-03-12: qty 4

## 2021-03-12 MED ORDER — MENTHOL 3 MG MT LOZG
1.0000 | LOZENGE | OROMUCOSAL | Status: DC | PRN
  Filled 2021-03-12: qty 9

## 2021-03-12 MED ORDER — HYDRALAZINE HCL 20 MG/ML IJ SOLN
INTRAMUSCULAR | Status: AC
  Filled 2021-03-12: qty 1

## 2021-03-12 MED ORDER — FENTANYL CITRATE PF 50 MCG/ML IJ SOSY
PREFILLED_SYRINGE | INTRAMUSCULAR | Status: AC
  Filled 2021-03-12: qty 3

## 2021-03-12 MED ORDER — GLYCOPYRROLATE 0.2 MG/ML IJ SOLN
INTRAMUSCULAR | Status: DC | PRN
  Administered 2021-03-12: .2 mg via INTRAVENOUS

## 2021-03-12 MED ORDER — MIDAZOLAM HCL 5 MG/5ML IJ SOLN
INTRAMUSCULAR | Status: DC | PRN
  Administered 2021-03-12: 2 mg via INTRAVENOUS

## 2021-03-12 MED ORDER — CHLORHEXIDINE GLUCONATE 0.12 % MT SOLN
15.0000 mL | Freq: Once | OROMUCOSAL | Status: AC
  Administered 2021-03-12: 15 mL via OROMUCOSAL

## 2021-03-12 MED ORDER — ONDANSETRON HCL 4 MG/2ML IJ SOLN
INTRAMUSCULAR | Status: DC | PRN
  Administered 2021-03-12: 4 mg via INTRAVENOUS

## 2021-03-12 MED ORDER — GLYCOPYRROLATE 0.2 MG/ML IJ SOLN
INTRAMUSCULAR | Status: AC
  Filled 2021-03-12: qty 1

## 2021-03-12 MED ORDER — ORAL CARE MOUTH RINSE: 15.0000 mL | Freq: Once | OROMUCOSAL | Status: AC

## 2021-03-12 MED ORDER — LACTATED RINGERS IR SOLN
Status: DC | PRN
  Administered 2021-03-12: 1000 mL

## 2021-03-12 MED ORDER — ONDANSETRON HCL 4 MG/2ML IJ SOLN
INTRAMUSCULAR | Status: AC
  Filled 2021-03-12: qty 2

## 2021-03-12 MED ORDER — TRAMADOL HCL 50 MG PO TABS: 50.0000 mg | ORAL_TABLET | Freq: Four times a day (QID) | ORAL | 0 refills | Status: DC | PRN

## 2021-03-12 MED ORDER — ROCURONIUM BROMIDE 10 MG/ML (PF) SYRINGE
PREFILLED_SYRINGE | INTRAVENOUS | Status: DC | PRN
  Administered 2021-03-12: 80 mg via INTRAVENOUS
  Administered 2021-03-12: 10 mg via INTRAVENOUS

## 2021-03-12 MED ORDER — OXYCODONE HCL 5 MG PO TABS: 5.0000 mg | ORAL_TABLET | Freq: Once | ORAL | Status: DC | PRN

## 2021-03-12 MED ORDER — ONDANSETRON 4 MG PO TBDP: 4.0000 mg | ORAL_TABLET | Freq: Four times a day (QID) | ORAL | Status: DC | PRN

## 2021-03-12 MED ORDER — EPHEDRINE SULFATE-NACL 50-0.9 MG/10ML-% IV SOSY
PREFILLED_SYRINGE | INTRAVENOUS | Status: DC | PRN
  Administered 2021-03-12: 5 mg via INTRAVENOUS

## 2021-03-12 MED ORDER — LACTATED RINGERS IV SOLN: INTRAVENOUS | Status: DC

## 2021-03-12 MED ORDER — BUPIVACAINE-EPINEPHRINE 0.25% -1:200000 IJ SOLN
INTRAMUSCULAR | Status: AC
  Filled 2021-03-12: qty 1

## 2021-03-12 MED ORDER — TRAMADOL HCL 50 MG PO TABS
50.0000 mg | ORAL_TABLET | Freq: Four times a day (QID) | ORAL | Status: DC | PRN
  Administered 2021-03-13 – 2021-03-14 (×2): 50 mg via ORAL
  Filled 2021-03-12 (×2): qty 1

## 2021-03-12 MED ORDER — HYDROMORPHONE HCL 1 MG/ML IJ SOLN
1.0000 mg | INTRAMUSCULAR | Status: DC | PRN
  Administered 2021-03-12: 1 mg via INTRAVENOUS
  Filled 2021-03-12: qty 1

## 2021-03-12 MED ORDER — DEXAMETHASONE SODIUM PHOSPHATE 4 MG/ML IJ SOLN
INTRAMUSCULAR | Status: DC | PRN
  Administered 2021-03-12: 10 mg via INTRAVENOUS

## 2021-03-12 MED ORDER — FENTANYL CITRATE PF 50 MCG/ML IJ SOSY
25.0000 ug | PREFILLED_SYRINGE | INTRAMUSCULAR | Status: DC | PRN
  Administered 2021-03-12 (×4): 25 ug via INTRAVENOUS
  Administered 2021-03-12: 50 ug via INTRAVENOUS

## 2021-03-12 MED ORDER — MIDAZOLAM HCL 2 MG/2ML IJ SOLN
INTRAMUSCULAR | Status: AC
  Filled 2021-03-12: qty 2

## 2021-03-12 MED ORDER — PROMETHAZINE HCL 25 MG/ML IJ SOLN: 6.2500 mg | INTRAMUSCULAR | Status: DC | PRN

## 2021-03-12 MED ORDER — OXYCODONE HCL 5 MG PO TABS: 5.0000 mg | ORAL_TABLET | ORAL | Status: DC | PRN

## 2021-03-12 MED ORDER — LIDOCAINE HCL (PF) 2 % IJ SOLN
INTRAMUSCULAR | Status: AC
  Filled 2021-03-12: qty 5

## 2021-03-12 MED ORDER — FENTANYL CITRATE (PF) 100 MCG/2ML IJ SOLN
INTRAMUSCULAR | Status: DC | PRN
  Administered 2021-03-12 (×4): 50 ug via INTRAVENOUS
  Administered 2021-03-12: 100 ug via INTRAVENOUS
  Administered 2021-03-12: 50 ug via INTRAVENOUS

## 2021-03-12 MED ORDER — CHLORHEXIDINE GLUCONATE CLOTH 2 % EX PADS: 6.0000 | MEDICATED_PAD | Freq: Once | CUTANEOUS | Status: DC

## 2021-03-12 MED ORDER — IBUPROFEN 400 MG PO TABS
600.0000 mg | ORAL_TABLET | Freq: Four times a day (QID) | ORAL | Status: DC | PRN
  Administered 2021-03-13: 600 mg via ORAL
  Filled 2021-03-12: qty 1

## 2021-03-12 MED ORDER — PHENOL 1.4 % MT LIQD
1.0000 | OROMUCOSAL | Status: DC | PRN
  Administered 2021-03-12: 1 via OROMUCOSAL
  Filled 2021-03-12: qty 177

## 2021-03-12 MED ORDER — SODIUM CHLORIDE 0.45 % IV SOLN: INTRAVENOUS | Status: DC

## 2021-03-12 MED ORDER — FENTANYL CITRATE (PF) 250 MCG/5ML IJ SOLN
INTRAMUSCULAR | Status: AC
  Filled 2021-03-12: qty 5

## 2021-03-12 MED ORDER — ACETAMINOPHEN 325 MG PO TABS
650.0000 mg | ORAL_TABLET | Freq: Four times a day (QID) | ORAL | Status: DC | PRN
  Administered 2021-03-12: 650 mg via ORAL
  Filled 2021-03-12: qty 2

## 2021-03-12 MED ORDER — HYDRALAZINE HCL 20 MG/ML IJ SOLN
10.0000 mg | Freq: Once | INTRAMUSCULAR | Status: AC
  Administered 2021-03-12: 10 mg via INTRAVENOUS

## 2021-03-12 MED ORDER — DEXAMETHASONE SODIUM PHOSPHATE 10 MG/ML IJ SOLN
INTRAMUSCULAR | Status: AC
  Filled 2021-03-12: qty 1

## 2021-03-12 MED ORDER — SUGAMMADEX SODIUM 200 MG/2ML IV SOLN
INTRAVENOUS | Status: DC | PRN
  Administered 2021-03-12: 200 mg via INTRAVENOUS

## 2021-03-12 MED ORDER — ACETAMINOPHEN 650 MG RE SUPP: 650.0000 mg | Freq: Four times a day (QID) | RECTAL | Status: DC | PRN

## 2021-03-12 SURGICAL SUPPLY — 44 items
BAG COUNTER SPONGE SURGICOUNT (BAG) ×1 IMPLANT
BINDER ABDOMINAL 12 ML 46-62 (SOFTGOODS) ×1 IMPLANT
CHLORAPREP W/TINT 26 (MISCELLANEOUS) ×3 IMPLANT
COVER SURGICAL LIGHT HANDLE (MISCELLANEOUS) ×3 IMPLANT
DECANTER SPIKE VIAL GLASS SM (MISCELLANEOUS) ×3 IMPLANT
DERMABOND ADVANCED (GAUZE/BANDAGES/DRESSINGS) ×2
DERMABOND ADVANCED .7 DNX12 (GAUZE/BANDAGES/DRESSINGS) IMPLANT
DEVICE SECURE STRAP 25 ABSORB (INSTRUMENTS) ×2 IMPLANT
DEVICE TROCAR PUNCTURE CLOSURE (ENDOMECHANICALS) ×3 IMPLANT
DRAPE UTILITY XL STRL (DRAPES) ×3 IMPLANT
ELECT PENCIL ROCKER SW 15FT (MISCELLANEOUS) ×1 IMPLANT
ELECT REM PT RETURN 15FT ADLT (MISCELLANEOUS) ×3 IMPLANT
GLOVE SURG ORTHO LTX SZ8 (GLOVE) ×3 IMPLANT
GLOVE SURG SYN 7.5  E (GLOVE) ×1
GLOVE SURG SYN 7.5 E (GLOVE) ×2 IMPLANT
GLOVE SURG SYN 7.5 PF PI (GLOVE) ×2 IMPLANT
GLOVE SURG UNDER POLY LF SZ7.5 (GLOVE) ×2 IMPLANT
GOWN STRL REUS W/TWL XL LVL3 (GOWN DISPOSABLE) ×6 IMPLANT
IRRIG SUCT STRYKERFLOW 2 WTIP (MISCELLANEOUS) ×3
IRRIGATION SUCT STRKRFLW 2 WTP (MISCELLANEOUS) IMPLANT
KIT BASIN OR (CUSTOM PROCEDURE TRAY) ×3 IMPLANT
KIT TURNOVER KIT A (KITS) IMPLANT
MARKER SKIN DUAL TIP RULER LAB (MISCELLANEOUS) ×3 IMPLANT
MESH VENTRALIGHT ST 6X8 (Mesh Specialty) ×1 IMPLANT
MESH VENTRLGHT ELLIPSE 8X6XMFL (Mesh Specialty) IMPLANT
NDL SPNL 22GX3.5 QUINCKE BK (NEEDLE) ×2 IMPLANT
NEEDLE SPNL 22GX3.5 QUINCKE BK (NEEDLE) ×3 IMPLANT
PAD POSITIONING PINK XL (MISCELLANEOUS) ×1 IMPLANT
PENCIL SMOKE EVACUATOR (MISCELLANEOUS) IMPLANT
SCISSORS LAP 5X35 DISP (ENDOMECHANICALS) ×3 IMPLANT
SET TUBE SMOKE EVAC HIGH FLOW (TUBING) ×3 IMPLANT
SHEARS HARMONIC ACE PLUS 36CM (ENDOMECHANICALS) ×3 IMPLANT
SLEEVE ENDOPATH XCEL 5M (ENDOMECHANICALS) ×2 IMPLANT
SLEEVE SURGEON STRL (DRAPES) ×1 IMPLANT
STRIP CLOSURE SKIN 1/2X4 (GAUZE/BANDAGES/DRESSINGS) ×4 IMPLANT
SUT MNCRL AB 4-0 PS2 18 (SUTURE) ×3 IMPLANT
SUT NOVA 1 T20/GS 25DT (SUTURE) ×2 IMPLANT
TOWEL OR 17X26 10 PK STRL BLUE (TOWEL DISPOSABLE) ×3 IMPLANT
TOWEL OR NON WOVEN STRL DISP B (DISPOSABLE) ×3 IMPLANT
TRAY FOLEY MTR SLVR 16FR STAT (SET/KITS/TRAYS/PACK) ×1 IMPLANT
TRAY LAPAROSCOPIC (CUSTOM PROCEDURE TRAY) ×3 IMPLANT
TROCAR BLADELESS OPT 5 100 (ENDOMECHANICALS) ×3 IMPLANT
TROCAR XCEL BLUNT TIP 100MML (ENDOMECHANICALS) IMPLANT
TROCAR XCEL NON-BLD 11X100MML (ENDOMECHANICALS) ×1 IMPLANT

## 2021-03-12 NOTE — Interval H&P Note (Signed)
History and Physical Interval Note:  03/12/2021 7:02 AM  Patrick Willis  has presented today for surgery, with the diagnosis of RECURRENT VENTRAL INCISIONAL HERNIA.  The various methods of treatment have been discussed with the patient and family. After consideration of risks, benefits and other options for treatment, the patient has consented to    Procedure(s): [place] (N/A) as a surgical intervention.    The patient's history has been reviewed, patient examined, no change in status, stable for surgery.  I have reviewed the patient's chart and labs.  Questions were answered to the patient's satisfaction.    Armandina Gemma, [facility] A [place] practice Office: [place]

## 2021-03-12 NOTE — Anesthesia Procedure Notes (Signed)
Procedure Name: Intubation Date/Time: 03/12/2021 7:29 AM Performed by: Eulas Post, Kasie Leccese W, CRNA Pre-anesthesia Checklist: Patient identified, Emergency Drugs available, Suction available and Patient being monitored Patient Re-evaluated:Patient Re-evaluated prior to induction Oxygen Delivery Method: Circle system utilized Preoxygenation: Pre-oxygenation with 100% oxygen Induction Type: IV induction Ventilation: Mask ventilation without difficulty Laryngoscope Size: Miller and 2 Grade View: Grade I Tube type: Oral Tube size: 7.5 mm Number of attempts: 1 Airway Equipment and Method: Stylet and Oral airway Placement Confirmation: ETT inserted through vocal cords under direct vision, positive ETCO2 and breath sounds checked- equal and bilateral Secured at: 23 cm Tube secured with: Tape Dental Injury: Teeth and Oropharynx as per pre-operative assessment

## 2021-03-12 NOTE — Progress Notes (Signed)
Transition of Care [facility]) Screening Note  Patient Details  Name: Patrick Willis Date of Birth: Oct 20, 1960  Transition of Care [facility]) CM/SW Contact:    Sherie Don, LCSW Phone Number: 03/12/2021, 1:04 PM  Transition of Care Department [facility]) has reviewed patient and no TOC needs have been identified at this time. We will continue to monitor patient advancement through interdisciplinary progression rounds. If new patient transition needs arise, please place a TOC consult.

## 2021-03-12 NOTE — Op Note (Signed)
Operative Note  Pre-operative Diagnosis:  recurrent ventral incisional hernia  Post-operative Diagnosis:  same  Surgeon:  Armandina Gemma, MD  Assistant:  none   Procedure:  laparoscopic recurrent ventral incisional hernia repair with mesh  Anesthesia:  general  Estimated Blood Loss:  minimal  Drains: none         Specimen: none  Indications:  Patient returns to my practice for follow-up having been evaluated on May 28, 2020 for ventral incisional hernia. Patient has a complex past surgical history with initial repair of his ventral hernia in [place] in 2001. This repair failed and he underwent repair of the incisional hernia in 2009 in [place]. This repair has also failed with recurrent ventral incisional hernia. Patient underwent bilateral inguinal hernia repair by my partner in 2017. A CT scan of the abdomen and pelvis was obtained on June 19, 2020. This demonstrated a paramedian epigastric ventral hernia containing omental fat. There was no involvement with the bowel.  Patient now comes to surgery for operative repair.  Procedure:  The patient was seen in the pre-op holding area. The risks, benefits, complications, treatment options, and expected outcomes were previously discussed with the patient. The patient agreed with the proposed plan and has signed the informed consent form.  The patient was brought to the operating room by the surgical team, identified as Gertie Gowda and the procedure verified. A "time out" was completed and the above information confirmed.  Following administration of general anesthesia, the patient was positioned and then prepped and draped in the usual aseptic fashion.  After ascertaining that an adequate level of anesthesia been achieved, the peritoneal cavity was accessed using an 5 mm Optiview trocar in the left upper quadrant at the costal margin.  Pneumoperitoneum was established.  A second 11 mm trocar was placed in the left lower quadrant under direct  vision.  Examination demonstrates a small umbilical hernia containing incarcerated omentum.  There is a larger defect just superior and to the right of midline.  This contains incarcerated omentum.  Using blunt dissection and judicious use of the harmonic scalpel, the omentum was freed from both hernia defects and reduced into the peritoneal cavity.  Falciform ligament is taken down for 4 or 5 cm cephalad.  Other adhesions to the anterior abdominal wall were taken down using the harmonic scalpel.  Good hemostasis is noted.  Using a spinal needle the defects are defined and marked.  Measurements are taken allowing for at least a 4 cm overlap from the margins of the defects.  A 15 cm x 20 cm Ventralex ST mesh is selected.  Suture sites on the mesh arm marked with a marking pen to correspond with markings on the skin.  Mesh is prepared with #1 Novafil sutures placed around the periphery equally spaced.  A total of 8 sutures were placed in the mesh.  Mesh was then moistened, rolled, and inserted through the 11 mm trocar into the peritoneal cavity.  It was deployed and properly oriented.  Two additional 5 mm trochars were placed in the right lower quadrant and right upper quadrant under direct vision.  Using the Endo Close, suture tags were retrieved through the abdominal wall and secured with hemostats.  After all 8 sutures had been retrieved, the sutures are pulled taut allowing the mesh to approximate the abdominal wall.  Visual inspection shows broad overlap around the hernia defects.  All 8 sutures are then tied securely and the suture tags excised.  Using a secure  strap device, staples are placed in 2 concentric rows around the periphery of the mesh.  There is good approximation of the mesh to the anterior abdominal wall without laxity.  Good hemostasis is noted.  Pneumoperitoneum is released and the mesh is allowed to approximate the underlying viscera and omentum.  Trochars are removed under direct vision  and good hemostasis is noted.  Pneumoperitoneum is completely evacuated and all trochars are removed.  Surgical wounds are anesthetized with local anesthetic.  The trocar sites are closed with interrupted 4-0 Monocryl subcuticular sutures.  Wounds are washed and dried and Dermabond is placed as dressing.  An abdominal binder is placed on the abdomen prior to leaving the operating room.  Patient is awakened from anesthesia and transported to the recovery room in stable condition.  The patient tolerated the procedure well.   Armandina Gemma, [place] [facility] Office: [phone]

## 2021-03-12 NOTE — Anesthesia Postprocedure Evaluation (Signed)
Anesthesia Post Note  Patient: Patrick Willis  Procedure(s) Performed: LAPAROSCOPIC VENTRAL INCISIONAL HERNIA REPAIR (Abdomen) INSERTION OF MESH (Abdomen)     Patient location during evaluation: PACU Anesthesia Type: General Level of consciousness: awake Pain management: pain level controlled Vital Signs Assessment: post-procedure vital signs reviewed and stable Respiratory status: spontaneous breathing and respiratory function stable Cardiovascular status: stable Postop Assessment: no apparent nausea or vomiting Anesthetic complications: no   No notable events documented.  Last Vitals:  Vitals:   03/12/21 1250 03/12/21 1355  BP: (!) 141/89 127/81  Pulse: 62 66  Resp: 14 15  Temp: 36.7 C 36.5 C  SpO2: 97% 97%    Last Pain:  Vitals:   03/12/21 1355  TempSrc: Oral  PainSc:    Pain Goal: Patients Stated Pain Goal: 4 (03/12/21 0547)                 Merlinda Frederick

## 2021-03-12 NOTE — Transfer of Care (Signed)
Immediate Anesthesia Transfer of Care Note  Patient: Patrick Willis  Procedure(s) Performed: LAPAROSCOPIC VENTRAL INCISIONAL HERNIA REPAIR (Abdomen) INSERTION OF MESH (Abdomen)  Patient Location: PACU  Anesthesia Type:General  Level of Consciousness: awake and alert   Airway & Oxygen Therapy: Patient Spontanous Breathing and Patient connected to face mask oxygen  Post-op Assessment: Report given to RN and Post -op Vital signs reviewed and stable  Post vital signs: Reviewed and stable  Last Vitals:  Vitals Value Taken Time  BP 167/112 03/12/21 0908  Temp    Pulse 58 03/12/21 0909  Resp 11 03/12/21 0908  SpO2 96 % 03/12/21 0909  Vitals shown include unvalidated device data.  Last Pain:  Vitals:   03/12/21 0547  TempSrc: Oral  PainSc: 0-No pain      Patients Stated Pain Goal: 4 (58/30/94 0768)  Complications: No notable events documented.

## 2021-03-12 NOTE — Discharge Instructions (Signed)
[facility]  HERNIA REPAIR POST OP INSTRUCTIONS  Always review your discharge instruction sheet given to you by the facility where your surgery was performed.  A  prescription for pain medication may be sent to your pharmacy on discharge.  Take your pain medication as prescribed.  If narcotic pain medicine is not needed, then you may take acetaminophen (Tylenol) or ibuprofen (Advil) as needed.  Take your usually prescribed medications unless otherwise directed.  If you need a refill on your pain medication, please contact your pharmacy.  They will contact our office to request authorization. Prescriptions will not be filled after 5:00 PM daily or on weekends.  You should follow a light diet the first 24 hours after arrival home, such as soup and crackers or toast.  Be sure to include plenty of fluids daily.  Resume your normal diet the day after surgery.  Most patients will experience some swelling and bruising around the surgical site.  Ice packs and reclining will help.  Swelling and bruising can take several days to resolve.   It is common to experience some constipation if taking pain medication after surgery.  Increasing fluid intake and taking a stool softener (such as Colace) will usually help or prevent this problem from occurring.  A mild laxative (Milk of Magnesia or Miralax) should be taken according to package directions if there is no bowel movement after 48 hours.  You will likely have Dermabond (topical glue) over your incisions.  This seals the incisions and allows you to bathe and shower at any time after your surgery.  Glue should remain in place for up to 10 days.  It may be removed after 10 days by pealing off the Dermabond material or using Vaseline or naval jelly to remove.  ACTIVITIES:  You may resume regular (light) daily activities beginning the next day - such as daily self-care, walking, climbing stairs - gradually increasing activities as tolerated.  You  may have sexual intercourse when it is comfortable.  Refrain from any heavy lifting or straining until approved by your doctor.  You may drive when you are no longer taking prescription pain medication, when you can comfortably wear a seatbelt, and when you can safely maneuver your car and apply the brakes.  You should see your doctor in the office for a follow-up appointment approximately 2-3 weeks after your surgery.  Make sure that you call for this appointment within a day or two after you arrive home to insure a convenient appointment time.  WHEN TO CALL YOUR DOCTOR: Fever greater than 101.0 Inability to urinate Persistent nausea and/or vomiting Extreme swelling or bruising Continued bleeding from incision Increased pain, redness, or drainage from the incision  The clinic staff is available to answer your questions during regular business hours.  Please don't hesitate to call and ask to speak to one of the nurses for clinical concerns.  If you have a medical emergency, go to the nearest emergency room or call 911.  A surgeon from [facility] is always on call for the hospital.   [facility] [address], [address], [place], [place]  [address]  [phone] ? [phone] ? FAX [phone] 

## 2021-03-13 MED ORDER — SENNOSIDES-DOCUSATE SODIUM 8.6-50 MG PO TABS
1.0000 | ORAL_TABLET | Freq: Two times a day (BID) | ORAL | Status: DC
  Administered 2021-03-13 (×2): 1 via ORAL
  Filled 2021-03-13 (×3): qty 1

## 2021-03-13 MED ORDER — POLYETHYLENE GLYCOL 3350 17 G PO PACK
17.0000 g | PACK | Freq: Every day | ORAL | Status: DC
  Administered 2021-03-13: 17 g via ORAL
  Filled 2021-03-13 (×2): qty 1

## 2021-03-13 NOTE — Progress Notes (Signed)
1 Day Post-Op   Subjective/Chief Complaint: Complains of tenderness. Burping but no flatus   Objective: Vital signs in last 24 hours: Temp:  [97.5 F (36.4 C)-98.7 F (37.1 C)] 98.3 F (36.8 C) (12/17 0906) Pulse Rate:  [59-70] 63 (12/17 0906) Resp:  [13-18] 18 (12/17 0906) BP: (124-150)/(75-95) 137/88 (12/17 0906) SpO2:  [93 %-98 %] 98 % (12/17 0906) Last BM Date: 03/12/21  Intake/Output from previous day: 12/16 0701 - 12/17 0700 In: 2691.6 [P.O.:1080; I.V.:1511.6; IV Piggyback:100] Out: 2120 [Urine:2100; Blood:20] Intake/Output this shift: No intake/output data recorded.  General appearance: alert and cooperative Resp: clear to auscultation bilaterally Cardio: regular rate and rhythm GI: soft, mild diffuse tenderness  Lab Results:  No results for input(s): WBC, HGB, HCT, PLT in the last 72 hours. BMET No results for input(s): NA, K, CL, CO2, GLUCOSE, BUN, CREATININE, CALCIUM in the last 72 hours. PT/INR No results for input(s): LABPROT, INR in the last 72 hours. ABG No results for input(s): PHART, HCO3 in the last 72 hours.  Invalid input(s): PCO2, PO2  Studies/Results: No results found.  Anti-infectives: Anti-infectives (From admission, onward)    Start     Dose/Rate Route Frequency Ordered Stop   03/12/21 0600  ceFAZolin (ANCEF) IVPB 2g/100 mL premix        2 g 200 mL/hr over 30 Minutes Intravenous On call to O.R. 03/12/21 0534 03/12/21 0740       Assessment/Plan: s/p Procedure(s): [place] (N/A) INSERTION OF MESH Continue bowel rest until ileus resolves POD1 ambulate  LOS: 1 day    Autumn Messing III 03/13/2021

## 2021-03-14 NOTE — Progress Notes (Signed)
Reviewed written d/c instructions w pt and all questions answered. He verbalized understanding. D/C per w/c w all belongings in stable condition.

## 2021-03-14 NOTE — Progress Notes (Signed)
2 Days Post-Op   Subjective/Chief Complaint: Feels better today. Had a bm. Wants to go home   Objective: Vital signs in last 24 hours: Temp:  [97.6 F (36.4 C)-98.6 F (37 C)] 97.8 F (36.6 C) (12/18 0615) Pulse Rate:  [62-67] 62 (12/18 0615) Resp:  [16-18] 16 (12/18 0615) BP: (138-152)/(87-98) 138/92 (12/18 0615) SpO2:  [93 %-99 %] 93 % (12/18 0615) Last BM Date: 03/12/21  Intake/Output from previous day: 12/17 0701 - 12/18 0700 In: 720 [P.O.:720] Out: 900 [Urine:900] Intake/Output this shift: No intake/output data recorded.  General appearance: alert and cooperative Resp: clear to auscultation bilaterally Cardio: regular rate and rhythm GI: soft, appropriately tender. Incisions look good  Lab Results:  No results for input(s): WBC, HGB, HCT, PLT in the last 72 hours. BMET No results for input(s): NA, K, CL, CO2, GLUCOSE, BUN, CREATININE, CALCIUM in the last 72 hours. PT/INR No results for input(s): LABPROT, INR in the last 72 hours. ABG No results for input(s): PHART, HCO3 in the last 72 hours.  Invalid input(s): PCO2, PO2  Studies/Results: No results found.  Anti-infectives: Anti-infectives (From admission, onward)    Start     Dose/Rate Route Frequency Ordered Stop   03/12/21 0600  ceFAZolin (ANCEF) IVPB 2g/100 mL premix        2 g 200 mL/hr over 30 Minutes Intravenous On call to O.R. 03/12/21 0534 03/12/21 0740       Assessment/Plan: s/p Procedure(s): [place] (N/A) INSERTION OF MESH Advance diet Discharge  LOS: 2 days    Autumn Messing III 03/14/2021

## 2021-03-15 ENCOUNTER — Encounter (HOSPITAL_COMMUNITY): Payer: Self-pay | Admitting: Surgery

## 2021-03-31 NOTE — Discharge Summary (Signed)
°  °  Physician Discharge Summary   Patient ID: Patrick Willis MRN: [id number] DOB/AGE: [birth year] 61 y.o.  Admit date: 03/12/2021  Discharge date: 03/14/2021  Discharge Diagnoses:  Principal Problem:   Recurrent ventral hernia with incarceration Active Problems:   Recurrent ventral incisional hernia   Discharged Condition: good  Hospital Course: Patient was admitted for observation following incisional hernia surgery.  Post op course was uncomplicated.  Pain was well controlled.  Tolerated diet.  Patient was prepared for discharge home on POD#2.   Consults: None  Treatments: surgery: lap ventral incisional hernia repair with mesh  Discharge Exam: Blood pressure (!) 138/92, pulse 62, temperature 97.8 F (36.6 C), temperature source Oral, resp. rate 16, height 5\' 10"  (1.778 m), weight 92.4 kg, SpO2 93 %. See progress notes.  Disposition: Home  Discharge Instructions     Call MD for:  difficulty breathing, headache or visual disturbances   Complete by: As directed    Call MD for:  extreme fatigue   Complete by: As directed    Call MD for:  hives   Complete by: As directed    Call MD for:  persistant dizziness or light-headedness   Complete by: As directed    Call MD for:  persistant nausea and vomiting   Complete by: As directed    Call MD for:  redness, tenderness, or signs of infection (pain, swelling, redness, odor or green/yellow discharge around incision site)   Complete by: As directed    Call MD for:  severe uncontrolled pain   Complete by: As directed    Call MD for:  temperature >100.4   Complete by: As directed    Diet - low sodium heart healthy   Complete by: As directed    Discharge instructions   Complete by: As directed    May shower. Diet as tolerated. No heavy lifting   Increase activity slowly   Complete by: As directed    No wound care   Complete by: As directed       Allergies as of 03/14/2021   No Known Allergies      Medication List      TAKE these medications    multivitamin with minerals tablet Take 1 tablet by mouth daily.   traMADol 50 MG tablet Commonly known as: ULTRAM Take 1-2 tablets (50-100 mg total) by mouth every 6 (six) hours as needed for moderate pain.        Follow-up Information     Armandina Gemma, MD. Schedule an appointment as soon as possible for a visit in 3 week(s).   Specialty: General Surgery Why: For wound re-check Contact information: [place] [address] [phone]                 Catrice Zuleta, [facility] Office: [phone]   Signed: Armandina Gemma 03/31/2021, 8:39 AM

## 2021-04-01 ENCOUNTER — Encounter (HOSPITAL_COMMUNITY): Payer: Self-pay | Admitting: Surgery

## 2021-04-14 ENCOUNTER — Telehealth: Payer: Self-pay

## 2021-04-14 NOTE — Telephone Encounter (Signed)
Transition Care Management Follow-up Telephone Call Date of discharge and from where: 03/14/2021   How have you been since you were released from the hospital? Doing great Any questions or concerns? No  Items Reviewed: Did the pt receive and understand the discharge instructions provided? Yes  Medications obtained and verified? Yes  Other? No  Any new allergies since your discharge? No  Dietary orders reviewed? No Do you have support at home?  unknown  Home Care and Equipment/Supplies: Were home health services ordered? not applicable If so, what is the name of the agency?  Has the agency set up a time to come to the patient's home? not applicable Were any new equipment or medical supplies ordered?  No What is the name of the medical supply agency?  Were you able to get the supplies/equipment? not applicable Do you have any questions related to the use of the equipment or supplies?   Functional Questionnaire: (I = Independent and D = Dependent) ADLs: I  Bathing/Dressing- I  Meal Prep- I  Eating- I  Maintaining continence- I  Transferring/Ambulation- I  Managing Meds- I  Follow up appointments reviewed:  PCP Hospital f/u appt confirmed? No   Specialist Hospital f/u appt confirmed? Yes  Scheduled to see Gerkin   already completed Are transportation arrangements needed? No  If their condition worsens, is the pt aware to call PCP or go to the Emergency Dept.? Yes Was the patient provided with contact information for the PCP's office or ED? Yes Was to pt encouraged to call back with questions or concerns? Yes  Tomasa Rand, RN, BSN, [facility] [employer] [phone]

## 2021-06-28 ENCOUNTER — Ambulatory Visit: Payer: Managed Care, Other (non HMO) | Admitting: Family Medicine

## 2021-07-01 ENCOUNTER — Ambulatory Visit (INDEPENDENT_AMBULATORY_CARE_PROVIDER_SITE_OTHER): Payer: Managed Care, Other (non HMO) | Admitting: Family Medicine

## 2021-07-01 ENCOUNTER — Encounter: Payer: Self-pay | Admitting: Family Medicine

## 2021-07-01 VITALS — BP 124/78 | HR 54 | Temp 98.1°F | Resp 16 | Ht 70.0 in | Wt 204.4 lb

## 2021-07-01 DIAGNOSIS — R7303 Prediabetes: Secondary | ICD-10-CM | POA: Diagnosis not present

## 2021-07-01 DIAGNOSIS — D7389 Other diseases of spleen: Secondary | ICD-10-CM | POA: Diagnosis not present

## 2021-07-01 LAB — POCT GLYCOSYLATED HEMOGLOBIN (HGB A1C): Hemoglobin A1C: 5.9 % — AB (ref 4.0–5.6)

## 2021-07-01 NOTE — Progress Notes (Signed)
? ?Subjective:  ?Patient ID: ERON STAAT, male    DOB: Apr 04, 1960  Age: 61 y.o. MRN: [id number] ? ?CC:  ?Chief Complaint  ?Patient presents with  ? splenic lesion  ?  Pt here for 6 month follow up no concerns from pt   ? ? ?HPI ?Gertie Gowda presents for  ? ? ?Splenic lesion ?See previous visits.  Initial CT 06/18/20: ?2.1 cm indeterminate low-attenuation lesion in spleen. Recommend ?follow-up imaging in 6 months, preferably with MRI. ? 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  Plan for repeat imaging in 6 months from September 12/22/20 MRI abdomen.  At that time lesion in the spleen was similar in size, perhaps even slightly smaller when compared to prior study. ? ?Ventral hernia repair  ?Repaired 03/12/21 - Dr. Harlow Asa. Healed, no restrictions.  ? ?Prediabetes: ?No change in diet/exercise since last OV ?No regular soda/sweet tea.  ?Few beers per week. No fast food.  ?Lab Results  ?Component Value Date  ? HGBA1C 5.9 (A) 07/01/2021  ? ?Wt Readings from Last 3 Encounters:  ?07/01/21 204 lb 6.4 oz (92.7 kg)  ?03/12/21 203 lb 9.6 oz (92.4 kg)  ?02/16/21 203 lb 9.6 oz (92.4 kg)  ? ? ?Hyperlipidemia: ?Borderline prior. No rx meds.  ?Not fasting today  ?Lab Results  ?Component Value Date  ? CHOL 174 12/23/2020  ? HDL 56.00 12/23/2020  ? LDLCALC 102 (H) 12/23/2020  ? TRIG 79.0 12/23/2020  ? CHOLHDL 3 12/23/2020  ? ?Lab Results  ?Component Value Date  ? ALT 12 12/23/2020  ? AST 13 12/23/2020  ? ALKPHOS 53 12/23/2020  ? BILITOT 0.4 12/23/2020  ? ? ? ?History ?Patient Active Problem List  ? Diagnosis Date Noted  ? Recurrent ventral incisional hernia 03/12/2021  ? Recurrent ventral hernia with incarceration 03/07/2021  ? Elevated systolic blood pressure reading without diagnosis of hypertension 11/25/2019  ? Sinus bradycardia by electrocardiogram 11/22/2019  ? First degree AV block 11/22/2019  ? ?Past Medical History:  ?Diagnosis Date  ? Hypertension   ? [employer]rees[employer] 10/28/2019  ? Sinus bradycardia on ECG   ? Rates  in 27s and 50s at rest-with 1 ?AVB  ? ?Past Surgical History:  ?Procedure Laterality Date  ? HERNIA REPAIR    ? INCISIONAL HERNIA REPAIR N/A 03/12/2021  ? Procedure: LAPAROSCOPIC VENTRAL INCISIONAL HERNIA REPAIR;  Surgeon: Armandina Gemma, MD;  Location: [facility];  Service: General;  Laterality: N/A;  ? INGUINAL HERNIA REPAIR    ? INSERTION OF MESH  03/12/2021  ? Procedure: INSERTION OF MESH;  Surgeon: Armandina Gemma, MD;  Location: [facility];  Service: General;;  ? WISDOM TOOTH EXTRACTION    ? ?No Known Allergies ?Prior to Admission medications   ?Medication Sig Start Date End Date Taking? Authorizing Provider  ?Multiple Vitamins-Minerals (MULTIVITAMIN WITH MINERALS) tablet Take 1 tablet by mouth daily.    [provider]  ? ?Social History  ? ?Socioeconomic History  ? Marital status: Married  ?  Spouse name: Not on file  ? Number of children: 3  ? Years of education: Not on file  ? Highest education level: Not on file  ?Occupational History  ? Occupation: Biochemist, clinical  ?Tobacco Use  ? Smoking status: Never  ? Smokeless tobacco: Never  ?Vaping Use  ? Vaping Use: Never used  ?Substance and Sexual Activity  ? Alcohol use: Yes  ?  Alcohol/week: 4.0 - 5.0 standard drinks  ?  Types: 4 - 5 Cans of beer per week  ? Drug  use: No  ? Sexual activity: Yes  ?Other Topics Concern  ? Not on file  ?Social History Narrative  ? Exercise walking x 2 miles four times/wk  ?   ? He and his family just returned from a trip to [place].  His twin daughters have been taking part in a high school summer abroad program in [place].  Their trip started in [place] and encompass much the [place] part of [place] including [place] and [place]  ? ?Social Determinants of Health  ? ?Financial Resource Strain: Not on file  ?Food Insecurity: Not on file  ?Transportation Needs: Not on file  ?Physical Activity: Not on file  ?Stress: Not on file  ?Social Connections: Not on file  ?Intimate Partner Violence: Not on file  ? ? ?Review of Systems ?Per HPI. Mr   ? ?Objective:  ? ?Vitals:  ? 07/01/21 1511  ?BP: 124/78  ?Pulse: (!) 54  ?Resp: 16  ?Temp: 98.1 ?F (36.7 ?C)  ?TempSrc: Temporal  ?SpO2: 96%  ?Weight: 204 lb 6.4 oz (92.7 kg)  ?Height: '5\' 10"'$  (1.778 m)  ? ? ?Physical Exam ?Vitals reviewed.  ?Constitutional:   ?   Appearance: He is well-developed.  ?HENT:  ?   Head: Normocephalic and atraumatic.  ?Neck:  ?   Vascular: No carotid bruit or JVD.  ?Cardiovascular:  ?   Rate and Rhythm: Normal rate and regular rhythm.  ?   Heart sounds: Normal heart sounds. No murmur heard. ?Pulmonary:  ?   Effort: Pulmonary effort is normal.  ?   Breath sounds: Normal breath sounds. No rales.  ?Abdominal:  ?   General: Abdomen is flat.  ?   Palpations: There is no mass.  ?   Tenderness: There is no abdominal tenderness.  ?Musculoskeletal:  ?   Right lower leg: No edema.  ?   Left lower leg: No edema.  ?Skin: ?   General: Skin is warm and dry.  ?Neurological:  ?   Mental Status: He is alert and oriented to person, place, and time.  ?Psychiatric:     ?   Mood and Affect: Mood normal.  ? ?Results for orders placed or performed in visit on 07/01/21  ?POCT glycosylated hemoglobin (Hb A1C)  ?Result Value Ref Range  ? Hemoglobin A1C 5.9 (A) 4.0 - 5.6 %  ? HbA1c POC (<> result, manual entry)    ? HbA1c, POC (prediabetic range)    ? HbA1c, POC (controlled diabetic range)    ? ? ? ? ?Assessment & Plan:  ?REI MEDLEN is a 61 y.o. male . ?Prediabetes - Plan: POCT glycosylated hemoglobin (Hb A1C) ? -Stable, improved A1c from previous testing.  Continue diet/exercise approach.  21-monthfollow-up for physical ? ?Splenic lesion - Plan: MR Abdomen W Wo Contrast ?Asymptomatic.  Repeat MRI to evaluate for stability.  Possible hemangioma versus hamartoma. ? ?No orders of the defined types were placed in this encounter. ? ?Patient Instructions  ?I will order updated MRI of abdomen.  ?Recheck in 6 months for physical. Watch diet, exercise for prediabetes.  ?Let me know if there are questions.   ? ? ? ? ?Signed,  ? ?JMerri Ray MD ?[facility] ?07/01/21 ?4:47 PM ? ? ?

## 2021-07-01 NOTE — Patient Instructions (Signed)
I will order updated MRI of abdomen.  ?Recheck in 6 months for physical. Watch diet, exercise for prediabetes.  ?Let me know if there are questions.  ? ?

## 2021-07-02 ENCOUNTER — Encounter: Payer: Self-pay | Admitting: Family Medicine

## 2021-07-02 NOTE — Addendum Note (Signed)
Addended by: Merri Ray R on: 07/02/2021 09:39 PM ? ? Modules accepted: Orders ? ?

## 2021-07-02 NOTE — Telephone Encounter (Signed)
Can you please change the location on pt scan to the [place] location, I attempted to change but it asked for rational and I was unsure of what to enter.  ?

## 2021-07-30 ENCOUNTER — Ambulatory Visit
Admission: RE | Admit: 2021-07-30 | Discharge: 2021-07-30 | Disposition: A | Discharge status: Home / Self Care | Payer: Managed Care, Other (non HMO) | Source: Ambulatory Visit | Attending: Family Medicine | Admitting: Family Medicine

## 2021-07-30 DIAGNOSIS — D7389 Other diseases of spleen: Secondary | ICD-10-CM

## 2021-07-30 IMAGING — MR MR ABDOMEN WO/W CM
12 of 19 series · 26 of 48 positions shown · IV contrast (19 ml multihaqnce)
Comparison: MRI abdomen [DATE] and CT [DATE]

CLINICAL DATA: Splenic lesion, evaluate for change.

EXAM:
MRI ABDOMEN WITHOUT AND WITH CONTRAST
TECHNIQUE: Multiplanar multisequence MR imaging of the abdomen was performed
both before and after the administration of intravenous contrast.
CONTRAST:  19mL MULTIHANCE GADOBENATE DIMEGLUMINE 529 MG/ML IV SOLN

[Series 3: cor haste · coronal · 5.0mm · 0.68mm/px · 2 of 35 slices shown]
[im 1/35]
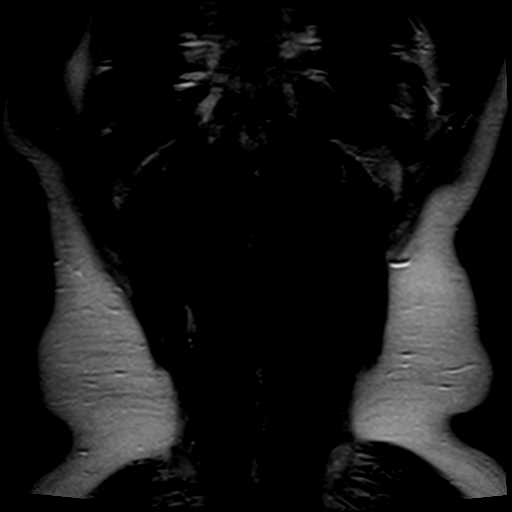
[im 35/35]
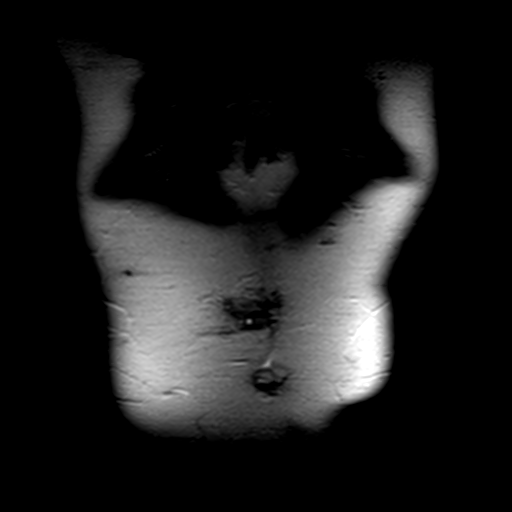

[Series 4: axial haste · axial · 6.0mm · 0.74mm/px · 1 of 37 slices shown]
[im 1/37]
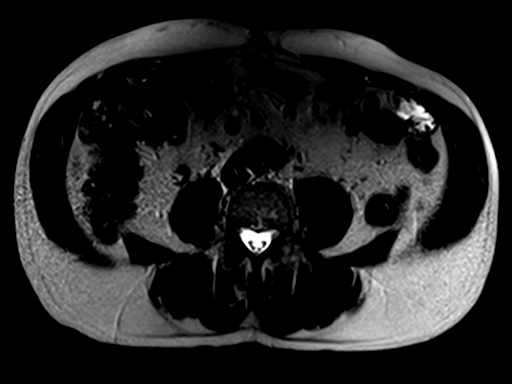

[Series 5: T1 · axial · 6.0mm · 0.74mm/px · z∈[-87,+144]mm · 2 of 72 slices shown]
[im 1/72]
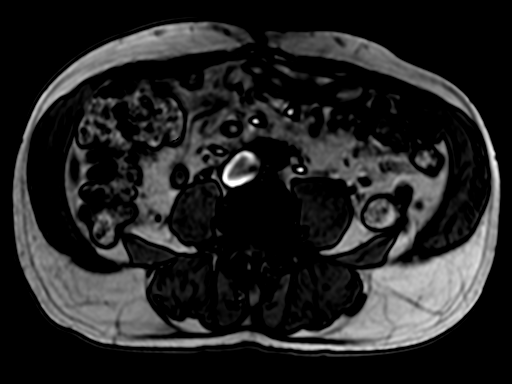
[im 72/72]
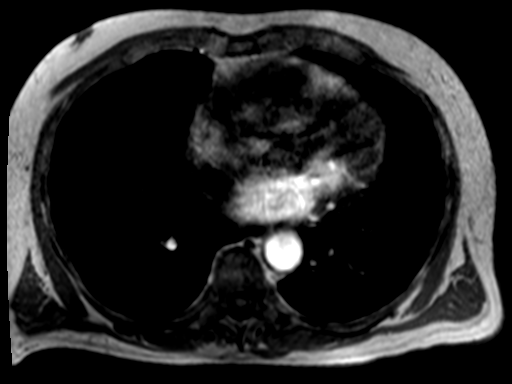

[Series 6: bSSFP · axial · 4.0mm · 0.74mm/px · z∈[-92,+148]mm · 2 of 61 slices shown]
[im 1/61]
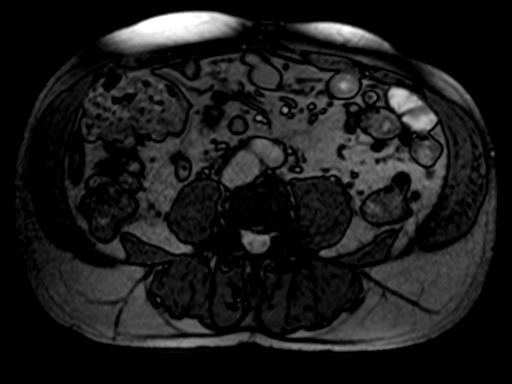
[im 61/61]
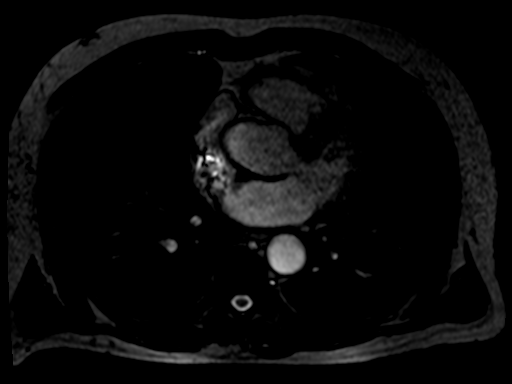

[Series 7: T2 fat-sat · axial · 6.0mm · 1.19mm/px · 1 of 36 slices shown]
[im 1/36]
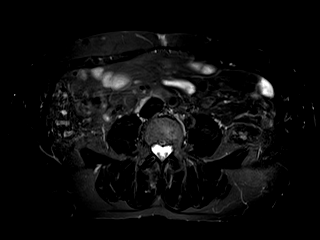

[Series 8: [id number]_trig · axial · 6.0mm · 1.98mm/px · z∈[-96,+148]mm · 3 of 105 slices shown]
[im 1/105]
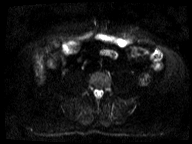
[im 53/105]
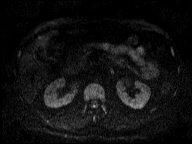
[im 105/105]
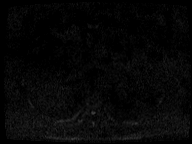

[Series 9: [id number]_trig_adc · axial · 6.0mm · 1.98mm/px · 1 of 35 slices shown]
[im 1/35]
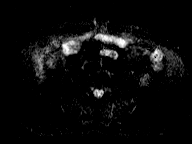

[Series 10: T1 dynamic · axial · non-contrast · 2.5mm · 0.74mm/px · z∈[-91,+147]mm · 3 of 96 slices shown]
[im 1/96]
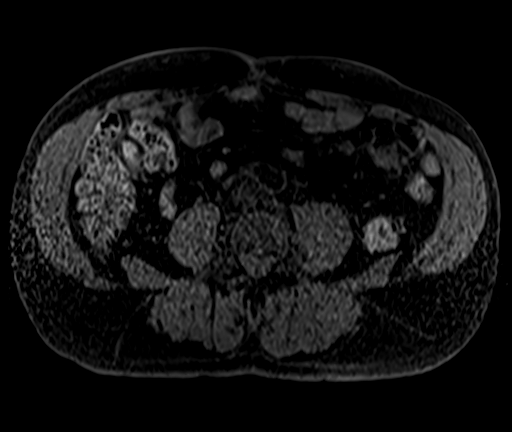
[im 48/96]
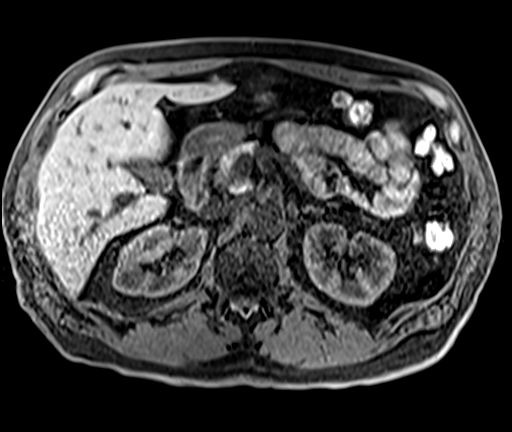
[im 96/96]
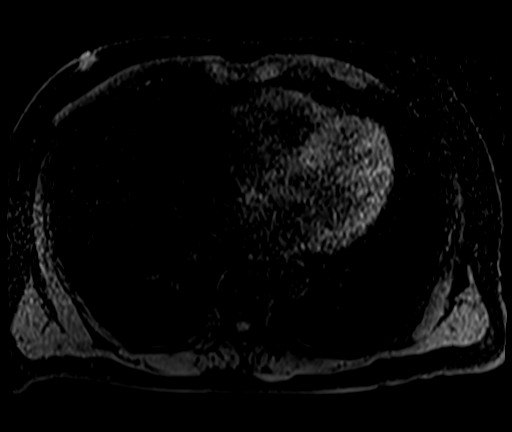

[Series 11: T1 dynamic post-contrast · axial · 2.5mm · 0.74mm/px · z∈[-91,+147]mm · 3 of 96 slices shown (1 of 4)]
[im 1/96]
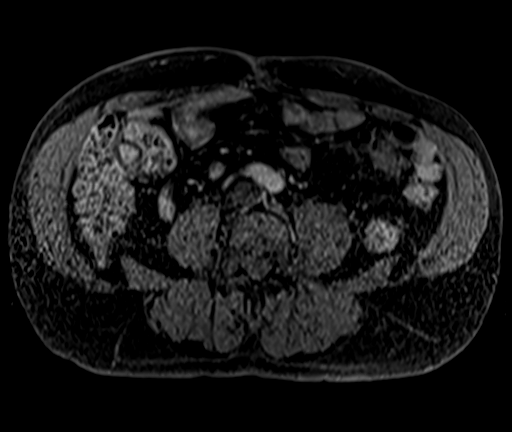
[im 48/96]
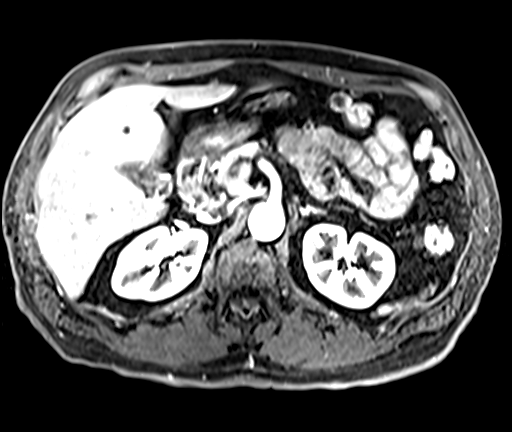
[im 96/96]
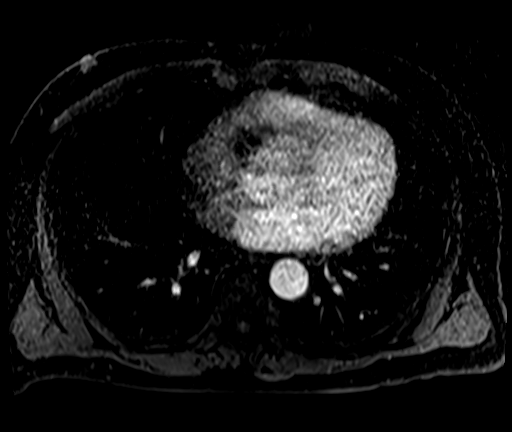

[Series 12: T1 dynamic post-contrast · axial · 2.5mm · 0.74mm/px · z∈[-91,+147]mm · 3 of 96 slices shown (2 of 4)]
[im 1/96]
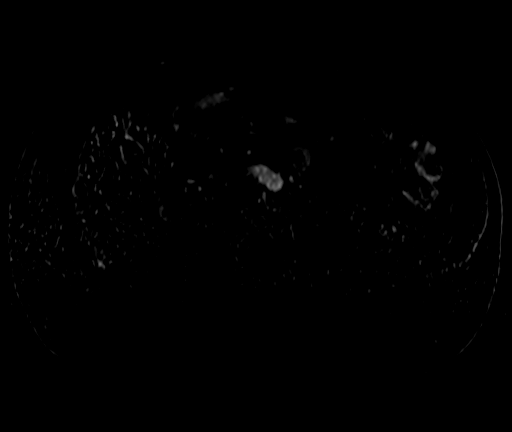
[im 48/96]
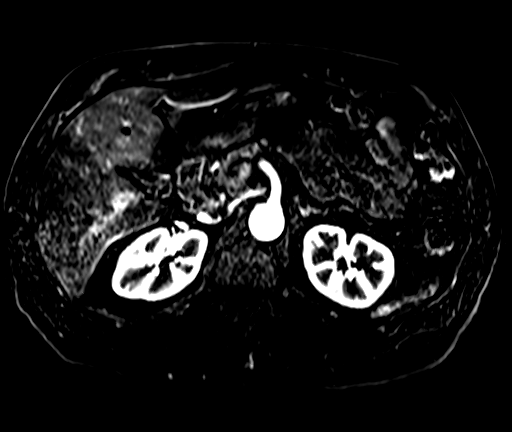
[im 96/96]
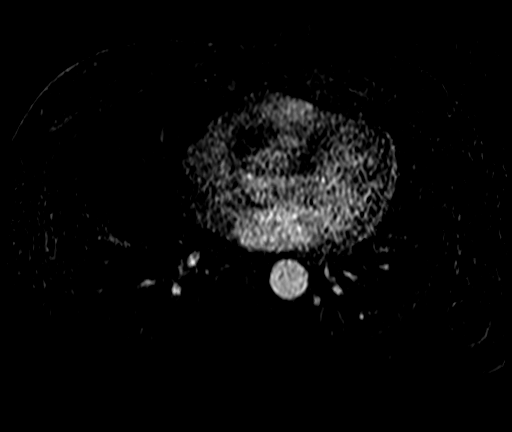

[Series 13: T1 dynamic post-contrast · axial · 2.5mm · 0.74mm/px · z∈[-91,+147]mm · 3 of 96 slices shown (3 of 4)]
[im 1/96]
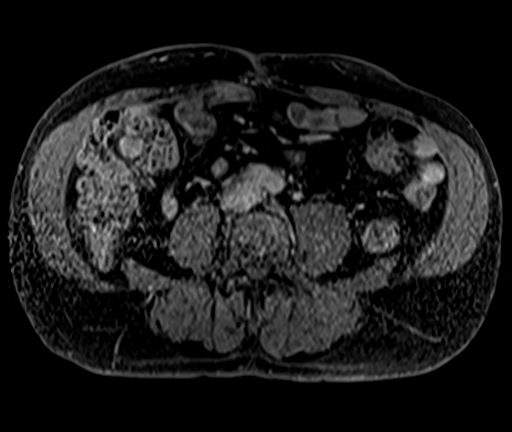
[im 48/96]
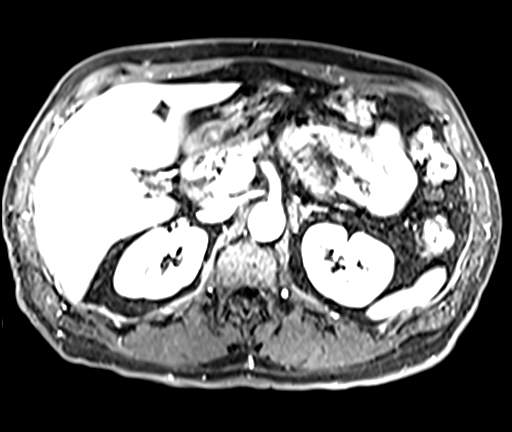
[im 96/96]
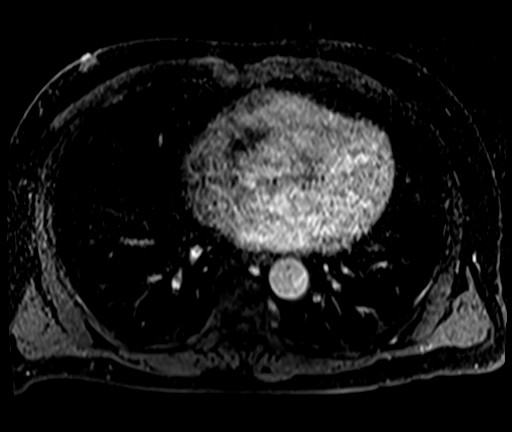

[Series 14: T1 dynamic post-contrast · axial · 2.5mm · 0.74mm/px · z∈[-91,+27]mm · 2 of 96 slices shown (4 of 4)]
[im 1/96]
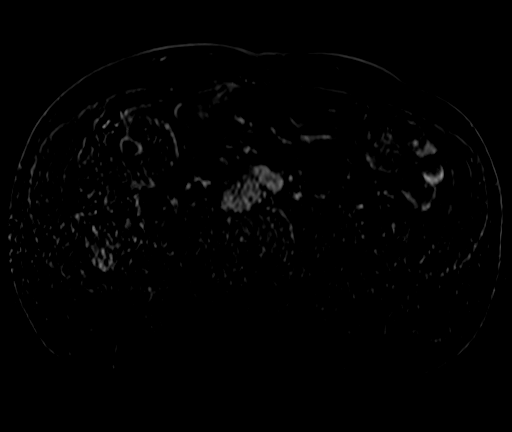
[im 48/96]
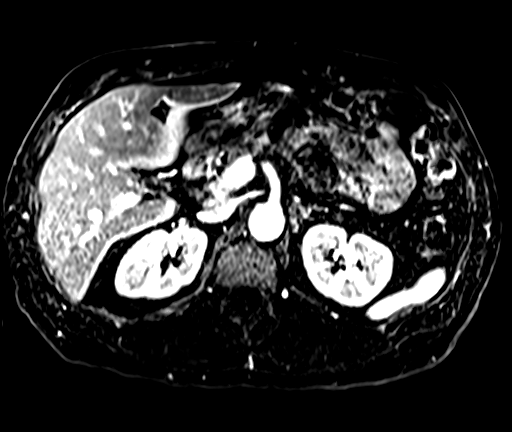

[26 of 48 positions shown; findings below may reference images not displayed]

FINDINGS: Lower chest: No acute abnormality.

Hepatobiliary: Bilobar hepatic cysts. No significant hepatic
steatosis. No suspicious hepatic lesion.

Cholelithiasis without findings of acute cholecystitis. No biliary
ductal dilation.

Pancreas: Intrinsic T1 signal of the pancreatic parenchyma is within
normal limits. No pancreatic ductal dilation. No cystic or solid
hyperenhancing pancreatic lesion identified.

Spleen: Well-circumscribed T2 hyperintense 17 x 16 mm splenic lesion
on image [DATE] previously measured 16 x 15 mm. Lesion demonstrates no
significant arterial phase enhancement but with progressive
enhancement throughout delayed postcontrast pulse sequences similar
in intensity to background aorta and greater in intensity than
background splenic parenchyma on 3 and 5 minute delayed pulse
sequences. No additional splenic lesion identified.

Adrenals/Urinary Tract: Bilateral adrenal glands appear normal.
Stable tiny bilateral renal cysts, which in the absence of
clinically indicated signs/symptoms require no independent
follow-up. No hydronephrosis

Stomach/Bowel: Colonic diverticulosis without findings of acute
diverticulitis.

Vascular/Lymphatic: The portal, splenic and superior mesenteric
veins are patent. Normal caliber abdominal aorta. No pathologically
enlarged abdominal lymph nodes.

Other: No abdominal free fluid. Diastasis rectus with a fat
containing ventral hernia

Musculoskeletal: No suspicious bone lesions identified.
IMPRESSION: 1. No significant interval change in the enhancing T2 hyperintense
splenic lesion which demonstrates benign imaging characteristics
almost certainly reflecting a splenic hemangioma.
2. Cholelithiasis without findings of acute cholecystitis.
3. Colonic diverticulosis without findings of acute diverticulitis.

## 2021-07-30 MED ORDER — GADOBENATE DIMEGLUMINE 529 MG/ML IV SOLN
19.0000 mL | Freq: Once | INTRAVENOUS | Status: AC | PRN
  Administered 2021-07-30: 19 mL via INTRAVENOUS

## 2021-09-23 ENCOUNTER — Encounter: Payer: Self-pay | Admitting: Family Medicine

## 2022-01-05 ENCOUNTER — Encounter: Payer: Self-pay | Admitting: Family Medicine

## 2022-01-05 ENCOUNTER — Encounter: Payer: Self-pay | Admitting: Gastroenterology

## 2022-01-05 ENCOUNTER — Ambulatory Visit: Payer: Managed Care, Other (non HMO) | Admitting: Family Medicine

## 2022-01-05 VITALS — BP 136/70 | HR 58 | Temp 98.2°F | Ht 70.0 in | Wt 206.6 lb

## 2022-01-05 DIAGNOSIS — Z125 Encounter for screening for malignant neoplasm of prostate: Secondary | ICD-10-CM | POA: Diagnosis not present

## 2022-01-05 DIAGNOSIS — Z0000 Encounter for general adult medical examination without abnormal findings: Secondary | ICD-10-CM | POA: Diagnosis not present

## 2022-01-05 DIAGNOSIS — R5383 Other fatigue: Secondary | ICD-10-CM

## 2022-01-05 DIAGNOSIS — Z23 Encounter for immunization: Secondary | ICD-10-CM

## 2022-01-05 DIAGNOSIS — Z130 Encounter for screening for diseases of the blood and blood-forming organs and certain disorders involving the immune mechanism: Secondary | ICD-10-CM

## 2022-01-05 DIAGNOSIS — R7303 Prediabetes: Secondary | ICD-10-CM | POA: Diagnosis not present

## 2022-01-05 DIAGNOSIS — G4733 Obstructive sleep apnea (adult) (pediatric): Secondary | ICD-10-CM

## 2022-01-05 DIAGNOSIS — Z1211 Encounter for screening for malignant neoplasm of colon: Secondary | ICD-10-CM

## 2022-01-05 LAB — CBC
HCT: 35.3 % — ABNORMAL LOW (ref 39.0–52.0)
Hemoglobin: 11.3 g/dL — ABNORMAL LOW (ref 13.0–17.0)
MCHC: 32 g/dL (ref 30.0–36.0)
MCV: 75.6 fl — ABNORMAL LOW (ref 78.0–100.0)
Platelets: 255 10*3/uL (ref 150.0–400.0)
RBC: 4.67 Mil/uL (ref 4.22–5.81)
RDW: 16.8 % — ABNORMAL HIGH (ref 11.5–15.5)
WBC: 6.2 10*3/uL (ref 4.0–10.5)

## 2022-01-05 LAB — COMPREHENSIVE METABOLIC PANEL
ALT: 17 U/L (ref 0–53)
AST: 16 U/L (ref 0–37)
Albumin: 4.4 g/dL (ref 3.5–5.2)
Alkaline Phosphatase: 53 U/L (ref 39–117)
BUN: 16 mg/dL (ref 6–23)
CO2: 29 mEq/L (ref 19–32)
Calcium: 9.3 mg/dL (ref 8.4–10.5)
Chloride: 104 mEq/L (ref 96–112)
Creatinine, Ser: 0.81 mg/dL (ref 0.40–1.50)
GFR: 95.81 mL/min (ref >60.00)
Glucose, Bld: 100 mg/dL — ABNORMAL HIGH (ref 70–99)
Potassium: 4.8 mEq/L (ref 3.5–5.1)
Sodium: 139 mEq/L (ref 135–145)
Total Bilirubin: 0.5 mg/dL (ref 0.2–1.2)
Total Protein: 6.9 g/dL (ref 6.0–8.3)

## 2022-01-05 LAB — LIPID PANEL
Cholesterol: 182 mg/dL (ref 0–200)
HDL: 57.8 mg/dL (ref >39.00)
LDL Cholesterol: 107 mg/dL — ABNORMAL HIGH (ref 0–99)
NonHDL: 124.04
Total CHOL/HDL Ratio: 3
Triglycerides: 85 mg/dL (ref 0.0–149.0)
VLDL: 17 mg/dL (ref 0.0–40.0)

## 2022-01-05 LAB — HEMOGLOBIN A1C: Hgb A1c MFr Bld: 6.2 % (ref 4.6–6.5)

## 2022-01-05 LAB — PSA: PSA: 0.67 ng/mL (ref 0.10–4.00)

## 2022-01-05 LAB — TSH: TSH: 1.95 u[IU]/mL (ref 0.35–5.50)

## 2022-01-05 NOTE — Patient Instructions (Addendum)
I would recommend follow up with sleep specialist to see if repeat testing or other recommendations for daytime fatigue and sleep apnea. I will refer you and check other labs. Return to the clinic or go to the nearest emergency room if any of your symptoms worsen or new symptoms occur.  I will check some screening labs as we discussed, refer you to gastroenterology for colon cancer screening.  Thanks for coming in today and take care.  Preventive Care 60-35 Years Old, Male Preventive care refers to lifestyle choices and visits with your health care provider that can promote health and wellness. Preventive care visits are also called wellness exams. What can I expect for my preventive care visit? Counseling During your preventive care visit, your health care provider may ask about your: Medical history, including: Past medical problems. Family medical history. Current health, including: Emotional well-being. Home life and relationship well-being. Sexual activity. Lifestyle, including: Alcohol, nicotine or tobacco, and drug use. Access to firearms. Diet, exercise, and sleep habits. Safety issues such as seatbelt and bike helmet use. Sunscreen use. Work and work Statistician. Physical exam Your health care provider will check your: Height and weight. These may be used to calculate your BMI (body mass index). BMI is a measurement that tells if you are at a healthy weight. Waist circumference. This measures the distance around your waistline. This measurement also tells if you are at a healthy weight and may help predict your risk of certain diseases, such as type 2 diabetes and high blood pressure. Heart rate and blood pressure. Body temperature. Skin for abnormal spots. What immunizations do I need?  Vaccines are usually given at various ages, according to a schedule. Your health care provider will recommend vaccines for you based on your age, medical history, and lifestyle or other  factors, such as travel or where you work. What tests do I need? Screening Your health care provider may recommend screening tests for certain conditions. This may include: Lipid and cholesterol levels. Diabetes screening. This is done by checking your blood sugar (glucose) after you have not eaten for a while (fasting). Hepatitis B test. Hepatitis C test. HIV (human immunodeficiency virus) test. STI (sexually transmitted infection) testing, if you are at risk. Lung cancer screening. Prostate cancer screening. Colorectal cancer screening. Talk with your health care provider about your test results, treatment options, and if necessary, the need for more tests. Follow these instructions at home: Eating and drinking  Eat a diet that includes fresh fruits and vegetables, whole grains, lean protein, and low-fat dairy products. Take vitamin and mineral supplements as recommended by your health care provider. Do not drink alcohol if your health care provider tells you not to drink. If you drink alcohol: Limit how much you have to 0-2 drinks a day. Know how much alcohol is in your drink. In the [place] one drink equals one 12 oz bottle of beer (355 mL), one 5 oz glass of wine (148 mL), or one 1 oz glass of hard liquor (44 mL). Lifestyle Brush your teeth every morning and night with fluoride toothpaste. Floss one time each day. Exercise for at least 30 minutes 5 or more days each week. Do not use any products that contain nicotine or tobacco. These products include cigarettes, chewing tobacco, and vaping devices, such as e-cigarettes. If you need help quitting, ask your health care provider. Do not use drugs. If you are sexually active, practice safe sex. Use a condom or other form of protection to prevent  STIs. Take aspirin only as told by your health care provider. Make sure that you understand how much to take and what form to take. Work with your health care provider to find out whether it is  safe and beneficial for you to take aspirin daily. Find healthy ways to manage stress, such as: Meditation, yoga, or listening to music. Journaling. Talking to a trusted person. Spending time with friends and family. Minimize exposure to UV radiation to reduce your risk of skin cancer. Safety Always wear your seat belt while driving or riding in a vehicle. Do not drive: If you have been drinking alcohol. Do not ride with someone who has been drinking. When you are tired or distracted. While texting. If you have been using any mind-altering substances or drugs. Wear a helmet and other protective equipment during sports activities. If you have firearms in your house, make sure you follow all gun safety procedures. What's next? Go to your health care provider once a year for an annual wellness visit. Ask your health care provider how often you should have your eyes and teeth checked. Stay up to date on all vaccines. This information is not intended to replace advice given to you by your health care provider. Make sure you discuss any questions you have with your health care provider. Document Revised: 09/09/2020 Document Reviewed: 09/09/2020 Elsevier Patient Education  [place].

## 2022-01-05 NOTE — Progress Notes (Signed)
Subjective:  Patient ID: Patrick Willis, male    DOB: [birth year]  Age: 61 y.o. MRN: [id number]  CC:  Chief Complaint  Patient presents with   Annual Exam    Pt is fasting doing well     HPI Patrick Willis presents for Annual Exam Care team PCP, me Surgery, Dr. Harlow Asa, hernia repair 03/12/2021.  Some afternoon daytime fatigue in past. Power nap for few mins and feels better, feels ok now.  Wears dental device to sleep apnea - test in 01/2018. moderate to severe sleep apnea - CPAP vs dental device as option.  Minimal snoring - still has some.   Splenic lesion noted on CT Noted previously by CT, then MRI in September 2022.  Lesion was similar in size perhaps prior study.  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 Repeat MRI 08/02/21: 1. 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 2. Cholelithiasis without findings of acute cholecystitis. 3. Colonic diverticulosis without findings of acute diverticulitis   Prediabetes: Last checked in April.  No current meds.  Borderline lipids previously. Avoiding soda.  Lab Results  Component Value Date   HGBA1C 5.9 (A) 07/01/2021   Wt Readings from Last 3 Encounters:  01/05/22 206 lb 9.6 oz (93.7 kg)  07/01/21 204 lb 6.4 oz (92.7 kg)  03/12/21 203 lb 9.6 oz (92.4 kg)        01/05/2022    8:17 AM 07/01/2021    3:13 PM 12/28/2020    2:13 PM 10/28/2019    1:20 PM 11/21/2018    1:52 PM  Depression screen PHQ 2/9  Decreased Interest 0 0 0 0 0  Down, Depressed, Hopeless 0 0 0 0 0  PHQ - 2 Score 0 0 0 0 0  Altered sleeping  0     Tired, decreased energy  1     Change in appetite  0     Feeling bad or failure about yourself   0     Trouble concentrating  0     Moving slowly or fidgety/restless  0     Suicidal thoughts  0     PHQ-9 Score  1       Health Maintenance  Topic Date Due   INFLUENZA VACCINE   10/26/2021   COVID-19 Vaccine (4 - Moderna risk series) 01/21/2022 (Originally 05/05/2020)   COLONOSCOPY (Pts 45-77yr Insurance coverage will need to be confirmed)  01/22/2022   TETANUS/TDAP  10/25/2027   Hepatitis C Screening  Completed   HIV Screening  Completed   Zoster Vaccines- Shingrix  Completed   HPV VACCINES  Aged Out  Colonoscopy 01/05/2012, repeat 10 years. Prostate: does not have family history of prostate cancer The natural history of prostate cancer and ongoing controversy regarding screening and potential treatment outcomes of prostate cancer has been discussed with the patient. The meaning of a false positive PSA and a false negative PSA has been discussed. He indicates understanding of the limitations of this screening test and wishes to proceed with screening PSA testing. Followed by dermatologist with routine screening. Lab Results  Component Value Date   PSA1 0.8 10/24/2019   PSA1 0.8 11/20/2018   PSA1 0.8 10/24/2017   PSA 1.03 12/23/2020   PSA 0.91 09/03/2015   PSA 0.80 06/02/2014      Immunization History  Administered Date(s) Administered   Influenza,inj,Quad PF,6+ Mos 11/21/2018, 12/28/2020   Influenza-Unspecified 01/28/2018   Moderna Sars-Covid-2  Vaccination 06/13/2019, 07/11/2019, 03/10/2020   Tdap 10/24/2017   Zoster Recombinat (Shingrix) 10/24/2017, 01/28/2018  Flu vaccine - today.  Covid booster - declines.   No results found. Wears glasses, optho appt few months ago - [facility]  Dental:every 6 months  Alcohol: Few beers per week.  Tobacco: none  Exercise:10k steps per day.    History Patient Active Problem List   Diagnosis Date Noted   Recurrent ventral incisional hernia 03/12/2021   Recurrent ventral hernia with incarceration 03/07/2021   Elevated systolic blood pressure reading without diagnosis of hypertension 11/25/2019   Sinus bradycardia by electrocardiogram 11/22/2019   First degree AV block 11/22/2019   Past Medical History:   Diagnosis Date   Hypertension    Phreesia 10/28/2019   Sinus bradycardia on ECG    Rates in 40s and 50s at rest-with 1 AVB   Past Surgical History:  Procedure Laterality Date   HERNIA REPAIR     INCISIONAL HERNIA REPAIR N/A 03/12/2021   Procedure: LAPAROSCOPIC VENTRAL Ali Chukson;  Surgeon: Armandina Gemma, MD;  Location: [facility];  Service: General;  Laterality: N/A;   INGUINAL HERNIA REPAIR     INSERTION OF MESH  03/12/2021   Procedure: INSERTION OF MESH;  Surgeon: Armandina Gemma, MD;  Location: [facility];  Service: General;;   WISDOM TOOTH EXTRACTION     No Known Allergies Prior to Admission medications   Medication Sig Start Date End Date Taking? Authorizing Provider  Multiple Vitamins-Minerals (MULTIVITAMIN WITH MINERALS) tablet Take 1 tablet by mouth daily.    [provider]   Social History   Socioeconomic History   Marital status: Married    Spouse name: Not on file   Number of children: 3   Years of education: Not on file   Highest education level: Not on file  Occupational History   Occupation: SALES REP  Tobacco Use   Smoking status: Never   Smokeless tobacco: Never  Vaping Use   Vaping Use: Never used  Substance and Sexual Activity   Alcohol use: Yes    Alcohol/week: 4.0 - 5.0 standard drinks of alcohol    Types: 4 - 5 Cans of beer per week   Drug use: No   Sexual activity: Yes  Other Topics Concern   Not on file  Social History Narrative   Exercise walking x 2 miles four times/wk      He and his family just returned from a trip to [place].  His twin daughters have been taking part in a high school summer abroad program in [place].  Their trip started in [place] and encompass much the [place] part of [place] including [place] and [place]   Social Determinants of Health   Financial Resource Strain: Not on [employer] Insecurity: Not on file  Transportation Needs: Not on file  Physical Activity: Not on file  Stress: Not on file  Social  Connections: Not on file  Intimate Partner Violence: Not on file    Review of Systems 13 point review of systems per patient health survey noted.  Negative other than as indicated above or in HPI.    Objective:   Vitals:   01/05/22 0819  BP: 136/70  Pulse: (!) 58  Temp: 98.2 F (36.8 C)  TempSrc: Oral  SpO2: 97%  Weight: 206 lb 9.6 oz (93.7 kg)  Height: '5\' 10"'$  (1.778 m)     Physical Exam Vitals reviewed.  Constitutional:      Appearance: He is well-developed.  HENT:     Head: Normocephalic and atraumatic.     Right Ear: External ear normal.     Left Ear: External ear normal.  Eyes:     Conjunctiva/sclera: Conjunctivae normal.     Pupils: Pupils are equal, round, and reactive to light.  Neck:     Thyroid: No thyromegaly.     Vascular: No carotid bruit or JVD.  Cardiovascular:     Rate and Rhythm: Normal rate and regular rhythm.     Heart sounds: Normal heart sounds. No murmur heard. Pulmonary:     Effort: Pulmonary effort is normal. No respiratory distress.     Breath sounds: Normal breath sounds. No wheezing or rales.  Abdominal:     General: There is no distension.     Palpations: Abdomen is soft.     Tenderness: There is no abdominal tenderness.  Musculoskeletal:        General: No tenderness. Normal range of motion.     Cervical back: Normal range of motion and neck supple.     Right lower leg: No edema.     Left lower leg: No edema.  Lymphadenopathy:     Cervical: No cervical adenopathy.  Skin:    General: Skin is warm and dry.  Neurological:     Mental Status: He is alert and oriented to person, place, and time.     Deep Tendon Reflexes: Reflexes are normal and symmetric.  Psychiatric:        Mood and Affect: Mood normal.        Behavior: Behavior normal.        Assessment & Plan:  Patrick Willis is a 61 y.o. male . Annual physical exam  - -anticipatory guidance as below in AVS, screening labs above. Health maintenance items as above in HPI  discussed/recommended as applicable.   OSA (obstructive sleep apnea) - Plan: Ambulatory referral to Sleep StudiesOther fatigue - Plan: Comprehensive metabolic panel, Ambulatory referral to Sleep Studies, CBC, TSH  -Question intermittent daytime fatigue relation to sleep apnea, not surgical device may be completely treating symptoms.  Will refer back to sleep specialist to discuss possible changes or updated testing on dental device to see if that is effective.  Check other labs above.  RTC precautions.  Screening for prostate cancer - Plan: PSA  Screening for deficiency anemia  Prediabetes - Plan: Comprehensive metabolic panel, Lipid panel, Hemoglobin A1c  -Continue to watch diet, exercise, check updated labs.  Screen for colon cancer - Plan: Ambulatory referral to Gastroenterology  Needs flu shot - Plan: Flu Vaccine QUAD 6+ mos PF IM (Fluarix Quad PF)   No orders of the defined types were placed in this encounter.  Patient Instructions  I would recommend follow up with sleep specialist to see if repeat testing or other recommendations for daytime fatigue and sleep apnea. I will refer you and check other labs. Return to the clinic or go to the nearest emergency room if any of your symptoms worsen or new symptoms occur.  I will check some screening labs as we discussed, refer you to gastroenterology for colon cancer screening.  Thanks for coming in today and take care.  Preventive Care 42-33 Years Old, Male Preventive care refers to lifestyle choices and visits with your health care provider that can promote health and wellness. Preventive care visits are also called wellness exams. What can I expect for my preventive care visit? Counseling During your preventive care visit, your health care provider may ask  about your: Medical history, including: Past medical problems. Family medical history. Current health, including: Emotional well-being. Home life and relationship  well-being. Sexual activity. Lifestyle, including: Alcohol, nicotine or tobacco, and drug use. Access to firearms. Diet, exercise, and sleep habits. Safety issues such as seatbelt and bike helmet use. Sunscreen use. Work and work Statistician. Physical exam Your health care provider will check your: Height and weight. These may be used to calculate your BMI (body mass index). BMI is a measurement that tells if you are at a healthy weight. Waist circumference. This measures the distance around your waistline. This measurement also tells if you are at a healthy weight and may help predict your risk of certain diseases, such as type 2 diabetes and high blood pressure. Heart rate and blood pressure. Body temperature. Skin for abnormal spots. What immunizations do I need?  Vaccines are usually given at various ages, according to a schedule. Your health care provider will recommend vaccines for you based on your age, medical history, and lifestyle or other factors, such as travel or where you work. What tests do I need? Screening Your health care provider may recommend screening tests for certain conditions. This may include: Lipid and cholesterol levels. Diabetes screening. This is done by checking your blood sugar (glucose) after you have not eaten for a while (fasting). Hepatitis B test. Hepatitis C test. HIV (human immunodeficiency virus) test. STI (sexually transmitted infection) testing, if you are at risk. Lung cancer screening. Prostate cancer screening. Colorectal cancer screening. Talk with your health care provider about your test results, treatment options, and if necessary, the need for more tests. Follow these instructions at home: Eating and drinking  Eat a diet that includes fresh fruits and vegetables, whole grains, lean protein, and low-fat dairy products. Take vitamin and mineral supplements as recommended by your health care provider. Do not drink alcohol if your  health care provider tells you not to drink. If you drink alcohol: Limit how much you have to 0-2 drinks a day. Know how much alcohol is in your drink. In the [place] one drink equals one 12 oz bottle of beer (355 mL), one 5 oz glass of wine (148 mL), or one 1 oz glass of hard liquor (44 mL). Lifestyle Brush your teeth every morning and night with fluoride toothpaste. Floss one time each day. Exercise for at least 30 minutes 5 or more days each week. Do not use any products that contain nicotine or tobacco. These products include cigarettes, chewing tobacco, and vaping devices, such as e-cigarettes. If you need help quitting, ask your health care provider. Do not use drugs. If you are sexually active, practice safe sex. Use a condom or other form of protection to prevent STIs. Take aspirin only as told by your health care provider. Make sure that you understand how much to take and what form to take. Work with your health care provider to find out whether it is safe and beneficial for you to take aspirin daily. Find healthy ways to manage stress, such as: Meditation, yoga, or listening to music. Journaling. Talking to a trusted person. Spending time with friends and family. Minimize exposure to UV radiation to reduce your risk of skin cancer. Safety Always wear your seat belt while driving or riding in a vehicle. Do not drive: If you have been drinking alcohol. Do not ride with someone who has been drinking. When you are tired or distracted. While texting. If you have been using any mind-altering substances or  drugs. Wear a helmet and other protective equipment during sports activities. If you have firearms in your house, make sure you follow all gun safety procedures. What's next? Go to your health care provider once a year for an annual wellness visit. Ask your health care provider how often you should have your eyes and teeth checked. Stay up to date on all vaccines. This information  is not intended to replace advice given to you by your health care provider. Make sure you discuss any questions you have with your health care provider. Document Revised: 09/09/2020 Document Reviewed: 09/09/2020 Elsevier Patient Education  [place],   Merri Ray, MD [facility] 01/05/22 8:36 AM

## 2022-01-06 ENCOUNTER — Other Ambulatory Visit: Payer: Self-pay | Admitting: Family Medicine

## 2022-01-06 ENCOUNTER — Encounter: Payer: Self-pay | Admitting: Family Medicine

## 2022-01-06 DIAGNOSIS — D649 Anemia, unspecified: Secondary | ICD-10-CM

## 2022-01-06 NOTE — Telephone Encounter (Signed)
Patient is awaiting the lab results does remark he gave blood about 3 weeks ago wasn't sure if this contributed to the low counts   Please advise

## 2022-01-06 NOTE — Progress Notes (Signed)
See lab notes

## 2022-01-06 NOTE — Telephone Encounter (Signed)
Replied by lab notes.

## 2022-01-07 ENCOUNTER — Encounter: Payer: Self-pay | Admitting: Gastroenterology

## 2022-01-18 ENCOUNTER — Other Ambulatory Visit (INDEPENDENT_AMBULATORY_CARE_PROVIDER_SITE_OTHER): Payer: Managed Care, Other (non HMO)

## 2022-01-18 DIAGNOSIS — D649 Anemia, unspecified: Secondary | ICD-10-CM

## 2022-01-18 LAB — CBC
HCT: 35.7 % — ABNORMAL LOW (ref 39.0–52.0)
Hemoglobin: 11.2 g/dL — ABNORMAL LOW (ref 13.0–17.0)
MCHC: 31.4 g/dL (ref 30.0–36.0)
MCV: 75.7 fl — ABNORMAL LOW (ref 78.0–100.0)
Platelets: 271 10*3/uL (ref 150.0–400.0)
RBC: 4.71 Mil/uL (ref 4.22–5.81)
RDW: 17.1 % — ABNORMAL HIGH (ref 11.5–15.5)
WBC: 6.7 10*3/uL (ref 4.0–10.5)

## 2022-01-19 ENCOUNTER — Other Ambulatory Visit: Payer: Self-pay | Admitting: Family Medicine

## 2022-01-19 ENCOUNTER — Encounter: Payer: Self-pay | Admitting: Family Medicine

## 2022-01-19 DIAGNOSIS — D649 Anemia, unspecified: Secondary | ICD-10-CM

## 2022-01-19 DIAGNOSIS — Z806 Family history of leukemia: Secondary | ICD-10-CM

## 2022-01-19 NOTE — Telephone Encounter (Signed)
Responded by lab notes.

## 2022-01-19 NOTE — Progress Notes (Signed)
See lab notes.  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  Refer to hematology for evaluation.

## 2022-01-27 ENCOUNTER — Encounter: Payer: Self-pay | Admitting: Neurology

## 2022-01-27 ENCOUNTER — Ambulatory Visit (INDEPENDENT_AMBULATORY_CARE_PROVIDER_SITE_OTHER): Payer: Managed Care, Other (non HMO) | Admitting: Neurology

## 2022-01-27 VITALS — BP 163/87 | HR 51 | Ht 70.0 in | Wt 209.0 lb

## 2022-01-27 DIAGNOSIS — R030 Elevated blood-pressure reading, without diagnosis of hypertension: Secondary | ICD-10-CM | POA: Diagnosis not present

## 2022-01-27 DIAGNOSIS — R001 Bradycardia, unspecified: Secondary | ICD-10-CM

## 2022-01-27 DIAGNOSIS — I440 Atrioventricular block, first degree: Secondary | ICD-10-CM | POA: Diagnosis not present

## 2022-01-27 DIAGNOSIS — G4733 Obstructive sleep apnea (adult) (pediatric): Secondary | ICD-10-CM | POA: Diagnosis not present

## 2022-01-27 NOTE — Progress Notes (Signed)
SLEEP MEDICINE CLINIC   Provider:  Larey Seat, M D  Primary Care Physician:  [name], MD   Referring Provider: [name], MD    Chief Complaint  Patient presents with   New Patient (Initial Visit)    Pt in room #10 and alone. Pt here today for sleep consults.    HPI:  Patrick Willis is a 61 y.o. male patient of Dr Ramond Dial and seen here as a new patient after a 4 year hiatus. He is a Hotel manager and usually office based.  Patrick Willis had been seen in 2019 and the essence of our first evaluation will be quoted below today's text.  He underwent a home sleep test on a WatchPAT on 11-4 2019 almost 4 years to his adult.  His Epworth sleepiness score was endorsed at 7 out of 24 points his fatigue severity was scored at 21 out of 63 and his body mass index was 29.5 at that time.  The patient had been scheduled for a sleep consult even a year earlier but was able to lose weight he felt that he had stopped snoring which had been confirmed by his wife, he felt less fatigued and therefore I had initially counseled the work-up then he again gained weight about 10 to 12 pounds and finally underwent a home sleep test which confirmed moderate sleep apnea with an AHI of 22.4/h moderate- severe snoring with an RDI of 26.3/h.  he did not have significant oxygen desaturation and basically had intermittent bradycardia , interpreted as a response to his apneic events.  The lowest heart rate was 48 bpm at night with the highest 99 bpm. ( A home sleep test device cannot give cardiac rhythm data only the rate is reflected here).  I have not recommended either a dental device or an outer titration CPAP the patient went to see Dr. Toy Cookey who made device for him that he has used over the last 4 years according to his wife this has helped his snoring and for a while he has felt that he slept better but he also had again weight loss and weight gain alternating and he developed higher blood  pressures as well is afternoon sleepiness that he would like to further investigate.  His interval medical history is positive for re-repair of abdominal hernia, 2 surgeries, the last 02-2021. Mesh removed. Dr. Barkley Bruns.  He had repeated ligher BP readings. He had low Hemoglobin, 11.3/ low HCT, MCV, RDW is elevated. He is a blood donor.   As to the family history the patient confirmed that his father was diagnosed with a myelodysplastic disorder but turned into leukemia for which she finally died.  His brother has Coombs negative hemolytic anemia because of increased red blood cell osmotic fragility he is mostly asymptomatic but he also carries a diagnosis of Gilbert's syndrome which he shared with his father.  The brother was also  diagnosed with diabetes type 2   Current Epworth is endorsed at 7/ 24     He had been seen here on 01-02-2018 upon referral from Dr. Carlota Raspberry for a sleep evaluation. Chief complaint according to patient : The patient had been scheduled for a consult 1 year ago, but lost weight, stopped snoring, felt less fatigue and therefor cancelled the work up.  In the meantime he has gained again ( about 10 pounds ) , and his last visit with PCP had elevated BP,  HTN. Also,  his wife stated he snores  more, not having observed apneas.  01-27-2022:Sleep habits are as follows: Dinnertime is between 6-7 pm, bedtime 10.30 pm, and promptly asleep.  He does not fall asleep on the sofa or in an armchair. He does use a dental device, mandibular advancement.  The patient's bedroom is described as cool, quiet and dark and conducive to sleep.  He usually sleeps on only one pillow on a non-adjustable bed- For head and neck support, there is another pillow that helps him with body positioning at night.  Rare GERD. He prefers to sleep on his side. Has 1-2 times nocturia, and goes back to sleep each time.  He wakes up spontaneously in the mornings, does not need to rely on an alarm for his routine  day,  around 5:30 AM.  If his last bathroom break would be very close to that normal rise time he may find it difficult to go back to sleep in the interval.   When he wakes up he is usually without headaches, no dizziness no palpitations no diaphoresis.  He feels usually well rested when he wakes up.  01-27-2022 :Sleep medical history and family sleep history: father had OSA on CPAP.  Brother snores, not sure if he is on CPAP.As to the family history the patient confirmed that his father was diagnosed with a myelodysplastic disorder but turned into leukemia for which she finally died.  His brother has Coombs negative hemolytic anemia because of increased red blood cell osmotic fragility he is mostly asymptomatic but he also carries a diagnosis of Gilbert's syndrome which he shared with his father.  The brother was also  diagnosed with diabetes type 2 No ENT surgery histoyr, TBI, no asthma, not frequent bronchitis or sinusitis.  No nasal deviation.   Social history: non smoker, alcohol use on weekends- 1-2 beers.  caffeine - 2 cups of coffee in AM on commute , and 1-2 more at work. No sodas, no iced tea.   Review of Systems: Out of a complete 14 system review, the patient complains of only the following symptoms, and all other reviewed systems are negative.  snoring.   Epworth score 7/ 24  , Fatigue severity score  17 from 21/ 63   , Geriatric depression score : 0/ 15 points.     Family History  Problem Relation Age of Onset   Cancer Mother    Cancer Father    Hypertension Father    Sleep apnea Father    Cancer Sister    Cancer Maternal Grandmother    Cancer Maternal Grandfather    Stroke Paternal Grandfather    Diabetes Paternal Grandfather      Past Surgical History:  Procedure Laterality Date   HERNIA REPAIR     INCISIONAL HERNIA REPAIR N/A 03/12/2021   Procedure: LAPAROSCOPIC VENTRAL INCISIONAL HERNIA REPAIR;  Surgeon: Armandina Gemma, MD;  Location: [facility];  Service: General;   Laterality: N/A;   INGUINAL HERNIA REPAIR     INSERTION OF MESH  03/12/2021   Procedure: INSERTION OF MESH;  Surgeon: Armandina Gemma, MD;  Location: [facility];  Service: General;;   WISDOM TOOTH EXTRACTION     Ventral and inguinal hernia repair, mash implanted.    Allergies as of 01/27/2022   (No Known Allergies)    Vitals: BP (!) 163/87 (BP Location: Left Arm, Patient Position: Sitting, Cuff Size: Normal)   Pulse (!) 51   Ht '5\' 10"'$  (1.778 m)   Wt 209 lb (94.8 kg)   BMI 29.99 kg/m  Last Weight:  Wt Readings from Last 1 Encounters:  01/27/22 209 lb (94.8 kg)   ERD:EYCX mass index is 29.99 kg/m.      Last Height:   Ht Readings from Last 1 Encounters:  01/27/22 '5\' 10"'$  (1.778 m)    Physical exam:  General: The patient is awake, alert and appears not in acute distress. The patient is well groomed. Head: Normocephalic, atraumatic. Neck is supple.  Mallampati 2-3 , midline,  neck circumference:17"  . Nasal airflow patent ,  Retrognathia is seen. Wore braces for over bite. Has a bridge. Now uses night guard  Cardiovascular:  Regular rate and rhythm , without  murmurs or carotid bruit, and without distended neck veins. Respiratory: Lungs are clear to auscultation. Skin:  Without evidence of edema, or rash Trunk: BMI is 29.6 kg/m2 . The patient's posture is erect.  Neurologic exam : The patient is awake and alert, oriented to place and time.   Memory subjective described as intact.  Attention span & concentration ability appears normal.  Speech is fluent,  without dysarthria, dysphonia or aphasia.  Mood and affect are appropriate.  Cranial nerves: Pupils are equal and briskly reactive to light.  Extraocular movements  in vertical and horizontal planes intact and without nystagmus.  Visual fields by finger perimetry are intact. Hearing to finger rub intact, but speaks loudler.   Facial sensation intact to fine touch. Facial motor strength is symmetric and tongue and uvula move  midline. Shoulder shrug was symmetrical.  Motor exam:  Normal tone, muscle bulk and symmetric strength in all extremities. Sensory:  Fine touch  and vibration were tested in all extremities. Proprioception tested in the upper extremities was normal. Coordination:  no  evidence of ataxia, dysmetria or tremor.  No change in coordination or penmanship.  Gait and station: Patient walks without assistive device and is able unassisted to climb up to the exam table.   Deep tendon reflexes: in the  upper and lower extremities are symmetric at 1 plus   Assessment: After physical and neurologic examination, review of laboratory studies,  Personal review of pre-existing records as far as provided in visit., my assessment is :   This patient had tested positive for OSA on a HST ( watch pat) with an AHI over 20/h in 2019 In the last 4 years he decided to use a dental device, which Dr. Toy Cookey provided .   1) afternoon sleepiness, at baseline he is snoring, which has been reduced by using the dental device - Dr. Augustina Mood. Overall daytime sleepiness score is still below 10. Has had elevated BP over the last 12 month, he has not gained weight in comparison to last visit.  (He had lost weight in the last 3 years and gained it back, but current BMI is comparable to BMI at time of HST).   Plan ) test now once more for apnea, would prefer in lab study with the dental device in place - I like to see if his dental device is protecting him sufficiently protecting him, controlling the  moderate-severe apnea.    If that is not authorized by [facility], I would I opt for a HST while using the dental device.   The patient was advised of the nature of the diagnosed disorder , the treatment options and the  risks for general health and wellness arising from not treating the condition.   I spent more than 40 minutes of face to face time with the patient.  Greater  than 50% of time was spent in counseling and coordination of  care. We have discussed the diagnosis and differential and I answered the patient's questions.       Larey Seat, MD 43/04/35, 9:44 AM  Certified in Neurology by ABPN Certified in [place] by [facility] [address], [place], [place] [address]

## 2022-01-27 NOTE — Patient Instructions (Signed)
Healthy Living: Sleep In this video, you will learn why sleep is an important part of a healthy lifestyle. To view the content, go to this web address: https://pe.[email or web address]) [phone]  This video will expire on: 12/01/2023. If you need access to this video following this date, please reach out to the healthcare provider who assigned it to you. This information is not intended to replace advice given to you by your health care provider. Make sure you discuss any questions you have with your health care provider. Elsevier Patient Education  [place] for Sleep Apnea  Sleep apnea is a condition in which breathing pauses or becomes shallow during sleep. Sleep apnea screening is a test to determine if you are at risk for sleep apnea. The test includes a series of questions. It will only takes a few minutes. Your health care provider may ask you to have this test in preparation for surgery or as part of a physical exam. What are the symptoms of sleep apnea? Common symptoms of sleep apnea include: Snoring. Waking up often at night. Daytime sleepiness. Pauses in breathing. Choking or gasping during sleep. Irritability. Forgetfulness. Trouble thinking clearly. Depression. Personality changes. Most people with sleep apnea do not know that they have it. What are the advantages of sleep apnea screening? Getting screened for sleep apnea can help: Ensure your safety. It is important for your health care providers to know whether or not you have sleep apnea, especially if you are having surgery or have other long-term (chronic) health conditions. Improve your health and allow you to get a better night's rest. Restful sleep can help you: Have more energy. Lose weight. Improve high blood pressure. Improve diabetes management. Prevent stroke. Prevent car accidents. What happens during the screening? Screening usually includes being asked a list of questions about your sleep  quality. Some questions you may be asked include: Do you snore? Is your sleep restless? Do you have daytime sleepiness? Has a partner or spouse told you that you stop breathing during sleep? Have you had trouble concentrating or memory loss? What is your age? What is your neck circumference? To measure your neck, keep your back straight and gently wrap the tape measure around your neck. Put the tape measure at the middle of your neck, between your chin and collarbone. What is your sex assigned at birth? Do you have or are you being treated for high blood pressure? If your screening test is positive, you are at risk for the condition. Further testing may be needed to confirm a diagnosis of sleep apnea. Where to find more information You can find screening tools online or at your health care clinic. For more information about sleep apnea screening and healthy sleep, visit these websites: Centers for Disease Control and Prevention: http://www.wolf.info/ American Sleep Apnea Association: www.sleepapnea.org Contact a health care provider if: You think that you may have sleep apnea. Summary Sleep apnea screening can help determine if you are at risk for sleep apnea. It is important for your health care providers to know whether or not you have sleep apnea, especially if you are having surgery or have other chronic health conditions. You may be asked to take a screening test for sleep apnea in preparation for surgery or as part of a physical exam. This information is not intended to replace advice given to you by your health care provider. Make sure you discuss any questions you have with your health care provider. Document Revised: 02/21/2020 Document Reviewed: 02/21/2020 Elsevier Patient  Education  [place].

## 2022-01-28 ENCOUNTER — Other Ambulatory Visit: Payer: Self-pay | Admitting: Family

## 2022-01-28 ENCOUNTER — Encounter: Payer: Self-pay | Admitting: Family

## 2022-01-28 ENCOUNTER — Inpatient Hospital Stay (HOSPITAL_BASED_OUTPATIENT_CLINIC_OR_DEPARTMENT_OTHER): Payer: Managed Care, Other (non HMO) | Admitting: Family

## 2022-01-28 ENCOUNTER — Telehealth: Payer: Self-pay

## 2022-01-28 ENCOUNTER — Inpatient Hospital Stay: Payer: Managed Care, Other (non HMO) | Attending: Family

## 2022-01-28 VITALS — BP 150/93 | HR 51 | Temp 98.0°F | Resp 16 | Ht 70.0 in | Wt 209.0 lb

## 2022-01-28 DIAGNOSIS — D500 Iron deficiency anemia secondary to blood loss (chronic): Secondary | ICD-10-CM

## 2022-01-28 DIAGNOSIS — R0602 Shortness of breath: Secondary | ICD-10-CM | POA: Diagnosis not present

## 2022-01-28 DIAGNOSIS — D649 Anemia, unspecified: Secondary | ICD-10-CM

## 2022-01-28 DIAGNOSIS — Z8719 Personal history of other diseases of the digestive system: Secondary | ICD-10-CM | POA: Diagnosis not present

## 2022-01-28 DIAGNOSIS — G4730 Sleep apnea, unspecified: Secondary | ICD-10-CM | POA: Diagnosis not present

## 2022-01-28 LAB — CBC WITH DIFFERENTIAL (CANCER CENTER ONLY)
Abs Immature Granulocytes: 0.1 10*3/uL — ABNORMAL HIGH (ref 0.00–0.07)
Basophils Absolute: 0 10*3/uL (ref 0.0–0.1)
Basophils Relative: 1 %
Eosinophils Absolute: 0.1 10*3/uL (ref 0.0–0.5)
Eosinophils Relative: 2 %
HCT: 37.4 % — ABNORMAL LOW (ref 39.0–52.0)
Hemoglobin: 11.4 g/dL — ABNORMAL LOW (ref 13.0–17.0)
Immature Granulocytes: 2 %
Lymphocytes Relative: 21 %
Lymphs Abs: 1.3 10*3/uL (ref 0.7–4.0)
MCH: 23.9 pg — ABNORMAL LOW (ref 26.0–34.0)
MCHC: 30.5 g/dL (ref 30.0–36.0)
MCV: 78.6 fL — ABNORMAL LOW (ref 80.0–100.0)
Monocytes Absolute: 0.4 10*3/uL (ref 0.1–1.0)
Monocytes Relative: 7 %
Neutro Abs: 4.3 10*3/uL (ref 1.7–7.7)
Neutrophils Relative %: 67 %
Platelet Count: 282 10*3/uL (ref 150–400)
RBC: 4.76 MIL/uL (ref 4.22–5.81)
RDW: 15.9 % — ABNORMAL HIGH (ref 11.5–15.5)
WBC Count: 6.3 10*3/uL (ref 4.0–10.5)
nRBC: 0 % (ref 0.0–0.2)

## 2022-01-28 LAB — CMP (CANCER CENTER ONLY)
ALT: 13 U/L (ref 0–44)
AST: 13 U/L — ABNORMAL LOW (ref 15–41)
Albumin: 4.4 g/dL (ref 3.5–5.0)
Alkaline Phosphatase: 54 U/L (ref 38–126)
Anion gap: 7 (ref 5–15)
BUN: 17 mg/dL (ref 6–20)
CO2: 26 mmol/L (ref 22–32)
Calcium: 9.4 mg/dL (ref 8.9–10.3)
Chloride: 106 mmol/L (ref 98–111)
Creatinine: 0.83 mg/dL (ref 0.61–1.24)
GFR, Estimated: >60 mL/min (ref >60)
Glucose, Bld: 117 mg/dL — ABNORMAL HIGH (ref 70–99)
Potassium: 4.1 mmol/L (ref 3.5–5.1)
Sodium: 139 mmol/L (ref 135–145)
Total Bilirubin: 0.5 mg/dL (ref 0.3–1.2)
Total Protein: 7 g/dL (ref 6.5–8.1)

## 2022-01-28 LAB — RETICULOCYTES
Immature Retic Fract: 25.3 % — ABNORMAL HIGH (ref 2.3–15.9)
RBC.: 4.8 MIL/uL (ref 4.22–5.81)
Retic Count, Absolute: 67.2 10*3/uL (ref 19.0–186.0)
Retic Ct Pct: 1.4 % (ref 0.4–3.1)

## 2022-01-28 LAB — SAVE SMEAR(SSMR), FOR PROVIDER SLIDE REVIEW

## 2022-01-28 LAB — LACTATE DEHYDROGENASE: LDH: 141 U/L (ref 98–192)

## 2022-01-28 LAB — IRON AND IRON BINDING CAPACITY (CC-WL,HP ONLY)
Iron: 37 ug/dL — ABNORMAL LOW (ref 45–182)
Saturation Ratios: 7 % — ABNORMAL LOW (ref 17.9–39.5)
TIBC: 501 ug/dL — ABNORMAL HIGH (ref 250–450)
UIBC: 464 ug/dL — ABNORMAL HIGH (ref 117–376)

## 2022-01-28 LAB — FERRITIN: Ferritin: 3 ng/mL — ABNORMAL LOW (ref 24–336)

## 2022-01-28 MED ORDER — FERROUS GLUCONATE 324 (38 FE) MG PO TABS: 324.0000 mg | ORAL_TABLET | Freq: Every day | ORAL | 4 refills | Status: DC

## 2022-01-28 NOTE — Progress Notes (Addendum)
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  No other blood loss noted. No bruising or petechiae.  Hgb today is 11.4, MCV 78%, platelets 282 and WBC count 6.3.  He notes occasional mild SOB with over exertion. He pauses for a break to rest and this resolves.  He states his overall energy is good. He is still playing tennis regularly throughout the week.  He states that he is due for colonoscopy and is scheduled for mid December. He has history of hemorrhoids.  His brother has history of coomb's negative hemolytic anemia d/t elevated RBC osmotic fragility. Patient's reticulocyte count is stable.  No personal history of cancer. His father had MDS that transitioned to leukemia, mother had breast, sister had skin, maternal grandmother had colon and maternal grandfather had lung (smoker).  He has history of sleep apnea and has been wearing a mouth guard. He is scheduled for sleep study to get a CPAP.  No history of diabetes or thyroid disease.  No fever, chills, n/v, cough, rash, dizziness, chest pain, palpitations, abdominal pain or changes in bowel or bladder habits.  No swelling, tenderness, numbness or tingling in his extremities at this time.  No falls or syncope.  No smoking or recreational drug use. He has a couple beers on the weekends.  Appetite and hydration have been good. He does eat meat products.  He works Psychologist, clinical for [employer].   ROS: All other 10 point review of systems is negative.   PAST MEDICAL  HISTORY:   Past Medical History:  Diagnosis Date   Hypertension    Phreesia 10/28/2019   Sinus bradycardia on ECG    Rates in 40s and 50s at rest-with 1 AVB    ALLERGIES: No Known Allergies    MEDICATIONS:  Current Outpatient Medications on File Prior to Visit  Medication Sig Dispense Refill   Multiple Vitamins-Minerals (MULTIVITAMIN WITH MINERALS) tablet Take 1 tablet by mouth daily.     No current facility-administered medications on file prior to visit.     PAST SURGICAL HISTORY Past Surgical History:  Procedure Laterality Date   HERNIA REPAIR     INCISIONAL HERNIA REPAIR N/A 03/12/2021   Procedure: LAPAROSCOPIC VENTRAL INCISIONAL HERNIA REPAIR;  Surgeon: Armandina Gemma, MD;  Location: [facility];  Service: General;  Laterality: N/A;   INGUINAL HERNIA REPAIR     INSERTION OF MESH  03/12/2021   Procedure: INSERTION OF MESH;  Surgeon: Armandina Gemma, MD;  Location: [facility];  Service: General;;   WISDOM TOOTH EXTRACTION      FAMILY HISTORY: Family History  Problem Relation Age of Onset   Cancer Mother    Cancer Father    Hypertension Father    Sleep apnea Father    Cancer Sister    Cancer Maternal Grandmother    Cancer Maternal Grandfather    Stroke Paternal Grandfather    Diabetes Paternal Grandfather     SOCIAL HISTORY:  reports that he has never smoked. He has never used smokeless tobacco.  He reports current alcohol use of about 4.0 - 5.0 standard drinks of alcohol per week. He reports that he does not use drugs.  PERFORMANCE STATUS: The patient's performance status is 0 - Asymptomatic  PHYSICAL EXAM: Most Recent Vital Signs: Blood pressure (!) 150/93, pulse (!) 51, temperature 98 F (36.7 C), temperature source Oral, resp. rate 16, height '5\' 10"'$  (1.778 m), weight 209 lb (94.8 kg), SpO2 100 %. BP (!) 150/93 (BP Location: Right Arm, Patient Position: Sitting)   Pulse (!) 51   Temp 98 F (36.7 C) (Oral)   Resp 16   Ht '5\' 10"'$  (1.778 m)   Wt 209 lb (94.8 kg)    SpO2 100%   BMI 29.99 kg/m   General Appearance:    Alert, cooperative, no distress, appears stated age  Head:    Normocephalic, without obvious abnormality, atraumatic  Eyes:    PERRL, conjunctiva/corneas clear, EOM's intact, fundi    benign, both eyes             Throat:   Lips, mucosa, and tongue normal; teeth and gums normal  Neck:   Supple, symmetrical, trachea midline, no adenopathy;       thyroid:  No enlargement/tenderness/nodules; no carotid   bruit or JVD  Back:     Symmetric, no curvature, ROM normal, no CVA tenderness  Lungs:     Clear to auscultation bilaterally, respirations unlabored  Chest wall:    No tenderness or deformity  Heart:    Regular rate and rhythm, S1 and S2 normal, no murmur, rub   or gallop  Abdomen:     Soft, non-tender, bowel sounds active all four quadrants,    no masses, no organomegaly        Extremities:   Extremities normal, atraumatic, no cyanosis or edema  Pulses:   2+ and symmetric all extremities  Skin:   Skin color, texture, turgor normal, no rashes or lesions  Lymph nodes:   Cervical, supraclavicular, and axillary nodes normal  Neurologic:   CNII-XII intact. Normal strength, sensation and reflexes      throughout    LABORATORY DATA:  Results for orders placed or performed in visit on 01/28/22 (from the past 48 hour(s))  CBC with Differential ([place] Only)     Status: Abnormal   Collection Time: 01/28/22  8:50 AM  Result Value Ref Range   WBC Count 6.3 4.0 - 10.5 K/uL   RBC 4.76 4.22 - 5.81 MIL/uL   Hemoglobin 11.4 (L) 13.0 - 17.0 g/dL   HCT 37.4 (L) 39.0 - 52.0 %   MCV 78.6 (L) 80.0 - 100.0 fL   MCH 23.9 (L) 26.0 - 34.0 pg   MCHC 30.5 30.0 - 36.0 g/dL   RDW 15.9 (H) 11.5 - 15.5 %   Platelet Count 282 150 - 400 K/uL   nRBC 0.0 0.0 - 0.2 %   Neutrophils Relative % 67 %   Neutro Abs 4.3 1.7 - 7.7 K/uL   Lymphocytes Relative 21 %   Lymphs Abs 1.3 0.7 - 4.0 K/uL   Monocytes Relative 7 %   Monocytes Absolute 0.4 0.1 - 1.0  K/uL   Eosinophils Relative 2 %   Eosinophils Absolute 0.1 0.0 - 0.5 K/uL   Basophils Relative 1 %   Basophils Absolute 0.0 0.0 - 0.1 K/uL   Immature Granulocytes 2 %   Abs Immature Granulocytes 0.10 (H) 0.00 - 0.07 K/uL    Comment: Performed at [facility] at  [facility], [address] [place], [place] [address]  CMP ([place] only)     Status: Abnormal   Collection Time: 01/28/22  8:50 AM  Result Value Ref Range   Sodium 139 135 - 145 mmol/L   Potassium 4.1 3.5 - 5.1 mmol/L   Chloride 106 98 - 111 mmol/L   CO2 26 22 - 32 mmol/L   Glucose, Bld 117 (H) 70 - 99 mg/dL    Comment: Glucose reference range applies only to samples taken after fasting for at least 8 hours.   BUN 17 6 - 20 mg/dL   Creatinine 0.83 0.61 - 1.24 mg/dL   Calcium 9.4 8.9 - 10.3 mg/dL   Total Protein 7.0 6.5 - 8.1 g/dL   Albumin 4.4 3.5 - 5.0 g/dL   AST 13 (L) 15 - 41 U/L   ALT 13 0 - 44 U/L   Alkaline Phosphatase 54 38 - 126 U/L   Total Bilirubin 0.5 0.3 - 1.2 mg/dL   GFR, Estimated >60 >60 mL/min    Comment: (NOTE) Calculated using the CKD-EPI Creatinine Equation (2021)    Anion gap 7 5 - 15    Comment: Performed at [facility] at [facility], [address] [place], [place] [address]  Iron and Iron Binding Capacity ([facility] only)     Status: Abnormal   Collection Time: 01/28/22  8:50 AM  Result Value Ref Range   Iron 37 (L) 45 - 182 ug/dL   TIBC 501 (H) 250 - 450 ug/dL   Saturation Ratios 7 (L) 17.9 - 39.5 %   UIBC 464 (H) 117 - 376 ug/dL    Comment: Performed at [facility], [place] [address], [place], [place] [address]  Reticulocytes     Status: Abnormal   Collection Time: 01/28/22  8:50 AM  Result Value Ref Range   Retic Ct Pct 1.4 0.4 - 3.1 %   RBC. 4.80 4.22 - 5.81 MIL/uL   Retic Count, Absolute 67.2 19.0 - 186.0 K/uL   Immature Retic Fract 25.3 (H) 2.3 - 15.9 %    Comment: Performed at [facility] Lab at [facility], [address] [place],  [address]  Save Smear for Provider Slide Review     Status: None   Collection Time: 01/28/22  8:50 AM  Result Value Ref Range   Smear Review SMEAR STAINED AND AVAILABLE FOR REVIEW     Comment: Performed at [facility] at [facility], [address], [place], [place] [address]  Lactate dehydrogenase (LDH)     Status: None   Collection Time: 01/28/22  8:51 AM  Result Value Ref Range   LDH 141 98 - 192 U/L    Comment: Performed at [facility] Lab at [facility], [address], [place],  [address]      RADIOGRAPHY: No results found.     PATHOLOGY: None   ASSESSMENT/PLAN: Mr. Ofarrell is a very pleasant 61 yo caucasian gentleman with recent mild microcytic anemia felt to be secondary to frequent (every 60 days) donation with the [facility].  Iron studies and epo are pending. We can get him onto [facility] if needed. He is already taking a probiotic daily.  Blood smear and CBC/CMP reviewed with Dr. Marin Olp. No abnormality or evidence of malignancy noted.  Follow-up in 3 months.  He will hold off on donating  blood. And give 2-3 times a year total.   All questions were answered. The patient knows to call the clinic with any problems, questions or concerns. We can certainly see the patient much sooner if necessary.  The patient was discussed with Dr. Marin Olp and he is in agreement with the aforementioned.   Lottie Dawson, NP

## 2022-01-28 NOTE — Telephone Encounter (Signed)
-----   Message from Celso Amy, NP sent at 01/28/2022  2:10 PM EDT ----- Iron saturation is low! No donating blood. I sent in a prescription for [facility] for him to take daily with food. Follow-up in 3 months. Scheduling will call to set up. Thank you!  Sarah  ----- Message ----- From: Buel Ream, Lab In May Sent: 01/28/2022   9:02 AM EDT To: Celso Amy, NP

## 2022-01-28 NOTE — Addendum Note (Signed)
Addended by: Lottie Dawson C on: 01/28/2022 02:10 PM   Modules accepted: Orders

## 2022-01-29 LAB — ERYTHROPOIETIN: Erythropoietin: 34 m[IU]/mL — ABNORMAL HIGH (ref 2.6–18.5)

## 2022-01-31 ENCOUNTER — Telehealth: Payer: Self-pay | Admitting: *Deleted

## 2022-01-31 ENCOUNTER — Telehealth: Payer: Self-pay | Admitting: Family

## 2022-01-31 ENCOUNTER — Telehealth: Payer: Self-pay | Admitting: Neurology

## 2022-01-31 NOTE — Telephone Encounter (Signed)
NPSG- [employer] no auth req ref # [id number].  Patient is scheduled at [facility] for 03/01/22 at 8 pm.  Mailed packet to the patient.

## 2022-01-31 NOTE — Telephone Encounter (Signed)
Patient has been scheduled for follow-up visit per 01/28/22 los. Pt given appt information via phone.

## 2022-01-31 NOTE — Telephone Encounter (Signed)
As noted below by Gillian Shields, I informed the patient of his iron saturation level. He is taking the [place] and probiotic daily. He started the [place] two days ago. We will recheck labs at next visit. He verbalized understanding.

## 2022-01-31 NOTE — Telephone Encounter (Signed)
-----   Message from Celso Amy, NP sent at 01/31/2022  9:09 AM EST ----- Iron saturation is quite low. Take the [facility] and probiotic daily! We will recheck at follow-up. Should come up nicely by then!  Sarah   ----- Message ----- From: Buel Ream, Lab In [place] Sent: 01/28/2022   9:02 AM EST To: Celso Amy, NP

## 2022-02-07 ENCOUNTER — Ambulatory Visit (AMBULATORY_SURGERY_CENTER): Payer: Self-pay | Admitting: *Deleted

## 2022-02-07 VITALS — Ht 70.0 in | Wt 211.2 lb

## 2022-02-07 DIAGNOSIS — Z1211 Encounter for screening for malignant neoplasm of colon: Secondary | ICD-10-CM

## 2022-02-07 MED ORDER — NA SULFATE-K SULFATE-MG SULF 17.5-3.13-1.6 GM/177ML PO SOLN: 1.0000 | Freq: Once | ORAL | 0 refills | Status: AC

## 2022-02-07 NOTE — Progress Notes (Signed)

## 2022-03-01 ENCOUNTER — Ambulatory Visit (INDEPENDENT_AMBULATORY_CARE_PROVIDER_SITE_OTHER): Payer: Managed Care, Other (non HMO) | Admitting: Neurology

## 2022-03-01 DIAGNOSIS — G4733 Obstructive sleep apnea (adult) (pediatric): Secondary | ICD-10-CM | POA: Diagnosis not present

## 2022-03-01 DIAGNOSIS — R030 Elevated blood-pressure reading, without diagnosis of hypertension: Secondary | ICD-10-CM

## 2022-03-01 DIAGNOSIS — I440 Atrioventricular block, first degree: Secondary | ICD-10-CM

## 2022-03-01 DIAGNOSIS — R001 Bradycardia, unspecified: Secondary | ICD-10-CM

## 2022-03-01 DIAGNOSIS — G4720 Circadian rhythm sleep disorder, unspecified type: Secondary | ICD-10-CM

## 2022-03-04 ENCOUNTER — Encounter: Payer: Managed Care, Other (non HMO) | Admitting: Gastroenterology

## 2022-03-08 ENCOUNTER — Encounter: Payer: Self-pay | Admitting: Gastroenterology

## 2022-03-09 ENCOUNTER — Encounter: Payer: Managed Care, Other (non HMO) | Admitting: Gastroenterology

## 2022-03-09 NOTE — Procedures (Signed)
Physician Interpretation:     [facility] POLYSOMNOGRAPHY  INTERPRETATION REPORT   STUDY DATE:  03/01/2022     PATIENT NAME:  Patrick Willis         DATE OF BIRTH:  07/08/1960  PATIENT ID:  [id number]    TYPE OF STUDY:  PSG  READING PHYSICIAN: Star Age, MD, PhD (read for Dr. Brett Fairy) REFERRED BY: Dr. Carlota Raspberry SCORING TECHNICIAN: Richard Miu, RPSGT   HISTORY:  61 year old male with an underlying medical history of splenic lesion, prior diagnosis of sleep apnea, and borderline obesity, who presents for evaluation of his OSA. He presents for a sleep study with a dental device in place. Epworth Sleepiness Score: 7/24. Height: 70 in Weight: 209 lb (BMI 29) Neck Size: 17 in   MEDICATIONS: Multivitamin  TECHNICAL DESCRIPTION: A registered sleep technologist was in attendance for the duration of the recording.  Data collection, scoring, video monitoring, and reporting were performed in compliance with the AASM Manual for the Scoring of Sleep and Associated Events; (Hypopnea is scored based on the criteria listed in Section VIII D. 1b in the AASM Manual V2.6 using a 4% oxygen desaturation rule or Hypopnea is scored based on the criteria listed in Section VIII D. 1a in the AASM Manual V2.6 using 3% oxygen desaturation and /or arousal rule).   SLEEP CONTINUITY AND SLEEP ARCHITECTURE:  Lights-out was at 21:13: and lights-on at  04:58:, with a total recording time of 7 hours, 45 min. Total sleep time ( TST) was 416.0 minutes with a normal sleep efficiency at 89.5%.    BODY POSITION:  TST was divided  between the following sleep positions: 72.1% supine;  27.9% lateral;  0% prone. Duration of total sleep and percent of total sleep in their respective position is as follows: supine 300 minutes (72%), non-supine 116 minutes (28%); right 106 minutes (25%), left 10 minutes (2%), and prone 00 minutes (0%).  Total supine REM sleep time was 34 minutes (63% of total REM  sleep).  Sleep latency was normal at 22.5 minutes.  REM sleep latency was normal at 94.5 minutes. Of the total sleep time, the percentage of stage N1 sleep was 3.4%, stage N2 sleep was 76%, which is increased, stage N3 sleep was 7.7%, and REM sleep was 13.0%, which is reduced. Wake after sleep onset (WASO) time accounted for 26.5 with minimal to mild sleep fragmentation noted.   RESPIRATORY MONITORING:  Based on CMS criteria (using a 4% oxygen desaturation rule for scoring hypopneas), there were 1 apneas (0 obstructive; 1 central; 0 mixed), and 33 hypopneas.  Apnea index was 0.1. Hypopnea index was 4.8. The apnea-hypopnea index was 4.8/hour overall (6.8 supine, 0 non-supine; 5.6 REM, 8.8 supine REM). There were 0 respiratory effort-related arousals (RERAs).  The RERA index was 0 events/h. Total respiratory disturbance index (RDI) was 4.9 events/h. RDI results showed: supine RDI  6.8 /h; non-supine RDI 0.0 /h; REM RDI 5.6 /h, supine REM RDI 8.8 /h.   Based on AASM criteria (using a 3% oxygen desaturation and /or arousal rule for scoring hypopneas), there were 1 apneas (0 obstructive; 1 central; 0 mixed), and 112 hypopneas. Apnea index was 0.1. Hypopnea index was 16.2. The apnea-hypopnea index was 16.3 overall (22.4 supine, 3 non-supine; 10.0 REM, 14.1 supine REM).  There were 0 respiratory effort-related arousals (RERAs).  The RERA index was 0 events/h. Total respiratory disturbance index (RDI) was 16.3 events/h. RDI results showed: supine RDI  22.4 /h; non-supine RDI 0.5 /h;  REM RDI 10.0 /h, supine REM RDI 14.1 /h.   OXIMETRY: Oxyhemoglobin Saturation Nadir during sleep was at  88%) from a mean of 94%.  Of the Total sleep time (TST)   hypoxemia (=<88%) was present for  0.6 minutes, or 0.1% of total sleep time.   LIMB MOVEMENTS: There were 0 periodic limb movements of sleep (0.0/hr), of which 0 (0.0/hr) were associated with an arousal.  AROUSAL: There were 71 arousals in total, for an arousal index of  10 arousals/hour.  Of these, 17 were identified as respiratory-related arousals (2 /h), 0 were PLM-related arousals (0 /h), and 64 were non-specific arousals (9 /h).  EEG: Review of the EEG showed no abnormal electrical discharges and symmetrical bihemispheric findings.    EKG: The EKG revealed normal sinus rhythm (NSR). The average heart rate during sleep was 49 bpm.   AUDIO/VIDEO REVIEW: The audio and video review did not show any abnormal or unusual behaviors, movements, phonations or vocalizations. The patient took no bathroom breaks.  Mild snoring was detected.  POST-STUDY QUESTIONNAIRE: Post study, the patient indicated, that sleep was the same as usual. The patient stated that he had not been using his dental device consistently for about a week prior to testing but after wearing it during the test he felt he had slept better than before.  IMPRESSION:   1. Obstructive sleep apnea (OSA) 2. Dysfunctions associated with sleep stages or arousal from sleep  RECOMMENDATIONS:  1. This study demonstrates overall borderline residual sleep apnea (using the 4% desaturation criteria for hypopneas), with a total AHI of 4.8/h while on treatment with the dental device.  The patient can maintain treatment with his dental device, and will be advised to use his oral appliance consistently.  He did report an ill fitting part of his dental device and will be recommended to have his device adjusted to comfort by his dentist.  2. This study shows minimal to mild sleep fragmentation and abnormal sleep stage percentages; these are nonspecific findings and per se do not signify an intrinsic sleep disorder or a cause for the patient's sleep-related symptoms. Causes include (but are not limited to) the first night effect of the sleep study, circadian rhythm disturbances, medication effect or an underlying mood disorder or medical problem.  3. The patient should be cautioned not to drive, work at heights, or operate  dangerous or heavy equipment when tired or sleepy. Review and reiteration of good sleep hygiene measures should be pursued with any patient. 4. The patient will be seen in follow-up by Dr. Brett Fairy as necessary. The referring provider will be notified of the test results.   I certify that I have reviewed the entire raw data recording prior to the issuance of this report in accordance with the Standards of Accreditation of the American Academy of Sleep Medicine (AASM). Star Age, MD, PhD Medical Director, [facility]) [place], [place] (Neurology and Sleep)              Technical Report:   General Information  Name: Maurio, Baize BMI: 29.99 Physician: Star Age, MD  ID: [id number] Height: 70.0 in Technician: Richard Miu, RPSGT  Sex: Male Weight: 209.0 lb Record: [id number]  Age: 37 [08/25/1960] Date: 03/01/2022    Medical & Medication History    61 y.o. male patient of Dr Ramond Dial and seen here as a new patient after a 4 year hiatus. He is a Hotel manager and usually office based. Mr. Strader had been seen  in 2019 and the essence of our first evaluation will be quoted below today's text. He underwent a home sleep test on a WatchPAT on 11-4 2019 almost 4 years to his adult. home sleep test which confirmed moderate sleep apnea with an AHI of 22.4/h moderate- severe snoring with an RDI of 26.3/h. he did not have significant oxygen desaturation   Multivitamin   Sleep Disorder      Comments   The patient came into the sleep lab for a PSG. He had a prior HST with our sleep lab on 01/29/18. The patient wore dental device during study per MD's order. The patient said he had recent dental work completed; therefore, his dental device does not fit well. Prior to the start of the study the tech talked to the patient about the ability to use his dental device since the doctor wanted him to wear it for the study. He stated he could wear the device it  just doesn't fit on his bottom teeth properly. Most respiratory events occurred in the supine position. No restroom trips. EKG kept in NSR. Mild snoring while supine. Leg movements. All respiratory events scored with a 3% desat. The patient slept supine and lateral.     Lights out: 09:13:30 PM Lights on: 04:58:25 AM   Time Total Supine Side Prone Upright  Recording (TRT) 7h 45.337m5h 38.537mh 6.37m337m 0.47m 937m0.47m  37mep (TST) 7h 4.47m 5h62m47m 1h 73m47m 0h 016m 0h 0.67m  Late54m N1 N2 N3 REM Onset Per. Slp. Eff.  Actual 0h 0.47m 0h 1.37m7m 16.47m66m 34.37m 53m22.37m 0247m2.37m 91247m%   Stg19mr Wake N1 N2 N3 REM  Total 41.0 14.0 316.0 32.0 54.0  Supine 30.5 10.5 231.5 24.0 34.0  Side 10.5 3.5 84.5 8.0 20.0  Prone 0.0 0.0 0.0 0.0 0.0  Upright 0.0 0.0 0.0 0.0 0.0   Stg % Wake N1 N2 N3 REM  Total 8.8 3.3 74.5 7.5 12.7  Supine 6.6 2.5 54.6 5.7 8.0  Side 2.3 0.8 19.9 1.9 4.7  Prone 0.0 0.0 0.0 0.0 0.0  Upright 0.0 0.0 0.0 0.0 0.0     Apnea Summary Sub Supine Side Prone Upright  Total 1 Total 1 1 0 0 0    REM 0 0 0 0 0    NREM 1 1 0 0 0  Obs 0 REM 0 0 0 0 0    NREM 0 0 0 0 0  Mix 0 REM 0 0 0 0 0    NREM 0 0 0 0 0  Cen 1 REM 0 0 0 0 0    NREM 1 1 0 0 0   Rera Summary Sub Supine Side Prone Upright  Total 0 Total 0 0 0 0 0    REM 0 0 0 0 0    NREM 0 0 0 0 0   Hypopnea Summary Sub Supine Side Prone Upright  Total 112 Total 112 111 1 0 0    REM '9 8 1 '$ 0 0    NREM 103 103 0 0 0   4% Hypopnea Summary Sub Supine Side Prone Upright  Total (4%) 33 Total 33 33 0 0 0    REM 5 5 0 0 0    NREM 28 28 0 0 0     AHI Total Obs Mix Cen  15.99 Apnea 0.14 0.00 0.00 0.14   Hypopnea 15.85 -- -- --  4.81 Hypopnea (4%) 4.67 -- -- --  Total Supine Side Prone Upright  Position AHI 15.99 21.82 0.52 0.00 0.00  REM AHI 10.00   NREM AHI 17.24   Position RDI 15.99 21.82 0.52 0.00 0.00  REM RDI 10.00   NREM RDI 17.24    4% Hypopnea Total Supine Side Prone Upright  Position AHI (4%) 4.81 6.62 0.00  0.00 0.00  REM AHI (4%) 5.56   NREM AHI (4%) 4.81   Position RDI (4%) 4.81 6.62 0.00 0.00 0.00  REM RDI (4%) 5.56   NREM RDI (4%) 4.81    Desaturation Information Threshold: 2% <100% <90% <80% <70% <60% <50% <40%  Supine 250.0 5.0 0.0 0.0 0.0 0.0 0.0  Side 23.0 0.0 0.0 0.0 0.0 0.0 0.0  Prone 0.0 0.0 0.0 0.0 0.0 0.0 0.0  Upright 0.0 0.0 0.0 0.0 0.0 0.0 0.0  Total 273.0 5.0 0.0 0.0 0.0 0.0 0.0  Index 37.1 0.7 0.0 0.0 0.0 0.0 0.0   Threshold: 3% <100% <90% <80% <70% <60% <50% <40%  Supine 109.0 5.0 0.0 0.0 0.0 0.0 0.0  Side 2.0 0.0 0.0 0.0 0.0 0.0 0.0  Prone 0.0 0.0 0.0 0.0 0.0 0.0 0.0  Upright 0.0 0.0 0.0 0.0 0.0 0.0 0.0  Total 111.0 5.0 0.0 0.0 0.0 0.0 0.0  Index 15.1 0.7 0.0 0.0 0.0 0.0 0.0   Threshold: 4% <100% <90% <80% <70% <60% <50% <40%  Supine 32.0 5.0 0.0 0.0 0.0 0.0 0.0  Side 1.0 0.0 0.0 0.0 0.0 0.0 0.0  Prone 0.0 0.0 0.0 0.0 0.0 0.0 0.0  Upright 0.0 0.0 0.0 0.0 0.0 0.0 0.0  Total 33.0 5.0 0.0 0.0 0.0 0.0 0.0  Index 4.5 0.7 0.0 0.0 0.0 0.0 0.0   Threshold: 3% <100% <90% <80% <70% <60% <50% <40%  Supine 109 5 0 0 0 0 0  Side 2 0 0 0 0 0 0  Prone 0 0 0 0 0 0 0  Upright 0 0 0 0 0 0 0  Total 111 5 0 0 0 0 0   Awakening/Arousal Information # of Awakenings 16  Wake after sleep onset 26.107m Wake after persistent sleep 26.536m Arousal Assoc. Arousals Index  Apneas 1 0.1  Hypopneas 16 2.3  Leg Movements 0 0.0  Snore 0 0.0  PTT Arousals 0 0.0  Spontaneous 64 9.1  Total 81 11.5  Leg Movement Information PLMS LMs Index  Total LMs during PLMS 0 0.0  LMs w/ Microarousals 0 0.0   LM LMs Index  w/ Microarousal 0 0.0  w/ Awakening 0 0.0  w/ Resp Event 0 0.0  Spontaneous 17 2.4  Total 17 2.4     Desaturation threshold setting: 3% Minimum desaturation setting: 10 seconds SaO2 nadir: 88% The longest event was a 60 sec obstructive Hypopnea with a minimum SaO2 of 88%. The lowest SaO2 was 88% associated with a 60 sec obstructive Hypopnea. EKG Rates EKG Avg Max  Min  Awake 54 90 47  Asleep 49 76 44  EKG Events: N/A

## 2022-03-11 ENCOUNTER — Ambulatory Visit: Payer: Managed Care, Other (non HMO) | Admitting: Gastroenterology

## 2022-03-11 ENCOUNTER — Encounter: Payer: Self-pay | Admitting: Gastroenterology

## 2022-03-11 VITALS — BP 135/80 | HR 41 | Temp 97.7°F | Resp 14 | Ht 70.0 in | Wt 211.2 lb

## 2022-03-11 DIAGNOSIS — Z1211 Encounter for screening for malignant neoplasm of colon: Secondary | ICD-10-CM | POA: Diagnosis not present

## 2022-03-11 DIAGNOSIS — D123 Benign neoplasm of transverse colon: Secondary | ICD-10-CM

## 2022-03-11 DIAGNOSIS — D122 Benign neoplasm of ascending colon: Secondary | ICD-10-CM | POA: Diagnosis not present

## 2022-03-11 MED ORDER — SODIUM CHLORIDE 0.9 % IV SOLN: 500.0000 mL | INTRAVENOUS | Status: DC

## 2022-03-11 NOTE — Progress Notes (Signed)
Called to room to assist during endoscopic procedure.  Patient ID and intended procedure confirmed with present staff. Received instructions for my participation in the procedure from the performing physician.  

## 2022-03-11 NOTE — Progress Notes (Signed)
[facility] History and Physical   Primary Care Physician:  [name], MD   Reason for Procedure:   Colon cancer screening  Plan:    Screening colonoscopy     HPI: Patrick Willis is a 61 y.o. male undergoing average risk screening colonoscopy.  He has no family history of colon cancer and no chronic GI symptoms.   He has had two previous colonoscopies, both out of state and both negative, per patient.  His grandmother had colon cancer and his mother had polyps, but no first degree relatives with colon cancer.  No chronic GI symptoms.  He does have iron deficiency anemia.   Past Medical History:  Diagnosis Date   Anemia    GERD (gastroesophageal reflux disease)    Hypertension    Phreesia 10/28/2019   Sinus bradycardia on ECG    Rates in 40s and 50s at rest-with 1 AVB   Sleep apnea    MILD,WEAR MOUTH GUARD    Past Surgical History:  Procedure Laterality Date   HERNIA REPAIR     INCISIONAL HERNIA REPAIR N/A 03/12/2021   Procedure: LAPAROSCOPIC VENTRAL INCISIONAL HERNIA REPAIR;  Surgeon: Armandina Gemma, MD;  Location: [facility];  Service: General;  Laterality: N/A;   INGUINAL HERNIA REPAIR     INSERTION OF MESH  03/12/2021   Procedure: INSERTION OF MESH;  Surgeon: Armandina Gemma, MD;  Location: [facility];  Service: General;;   WISDOM TOOTH EXTRACTION      Prior to Admission medications   Medication Sig Start Date End Date Taking? Authorizing Provider  ferrous gluconate (FERGON) 324 MG tablet Take 1 tablet (324 mg total) by mouth daily with breakfast. 01/28/22  Yes Celso Amy, NP  Multiple Vitamins-Minerals (MULTIVITAMIN WITH MINERALS) tablet Take 1 tablet by mouth daily.   Yes [provider]    Current Outpatient Medications  Medication Sig Dispense Refill   ferrous gluconate (FERGON) 324 MG tablet Take 1 tablet (324 mg total) by mouth daily with breakfast. 30 tablet 4   Multiple Vitamins-Minerals (MULTIVITAMIN WITH MINERALS) tablet Take 1 tablet  by mouth daily.     Current Facility-Administered Medications  Medication Dose Route Frequency Provider Last Rate Last Admin   0.9 %  sodium chloride infusion  500 mL Intravenous Continuous Daryel November, MD        Allergies as of 03/11/2022   (No Known Allergies)    Family History  Problem Relation Age of Onset   Colon polyps Mother    Cancer Mother    Cancer Father    Hypertension Father    Sleep apnea Father    Cancer Sister    Colon cancer Maternal Grandmother    Cancer Maternal Grandmother    Cancer Maternal Grandfather    Stroke Paternal Grandfather    Diabetes Paternal Grandfather    Crohn's disease Neg Hx    Esophageal cancer Neg Hx    Rectal cancer Neg Hx    Stomach cancer Neg Hx    Ulcerative colitis Neg Hx     Social History   Socioeconomic History   Marital status: Married    Spouse name: Not on file   Number of children: 3   Years of education: Not on file   Highest education level: Not on file  Occupational History   Occupation: Biochemist, clinical  Tobacco Use   Smoking status: Never    Passive exposure: Never   Smokeless tobacco: Never  Vaping Use   Vaping Use: Never  used  Substance and Sexual Activity   Alcohol use: Yes    Alcohol/week: 4.0 - 5.0 standard drinks of alcohol    Types: 4 - 5 Cans of beer per week    Comment: OCCASSIONAL   Drug use: No   Sexual activity: Yes  Other Topics Concern   Not on file  Social History Narrative   Exercise walking x 2 miles four times/wk      He and his family just returned from a trip to [place].  His twin daughters have been taking part in a high school summer abroad program in [place].  Their trip started in [place] and encompass much the [place] part of [place] including [place] and [place]   Social Determinants of Health   Financial Resource Strain: Not on [employer] Insecurity: Not on file  Transportation Needs: Not on file  Physical Activity: Not on file  Stress: Not on file  Social Connections:  Not on file  Intimate Partner Violence: Not on file    Review of Systems:  All other review of systems negative except as mentioned in the HPI.  Physical Exam: Vital signs BP 113/71   Pulse (!) 52   Temp 97.7 F (36.5 C) (Temporal)   Ht '5\' 10"'$  (1.778 m)   Wt 211 lb 3.2 oz (95.8 kg)   SpO2 100%   BMI 30.30 kg/m   General:   Alert,  Well-developed, well-nourished, pleasant and cooperative in NAD Airway:  Mallampati 2 Lungs:  Clear throughout to auscultation.   Heart:  Regular rate and rhythm; no murmurs, clicks, rubs,  or gallops. Abdomen:  Soft, nontender and nondistended. Normal bowel sounds.   Neuro/Psych:  Normal mood and affect. A and O x 3   Yuliet Needs E. Candis Schatz, MD [facility]

## 2022-03-11 NOTE — Progress Notes (Signed)
A and O x3. Report to RN. Tolerated MAC anesthesia well. 

## 2022-03-11 NOTE — Progress Notes (Signed)
Pt's states no medical or surgical changes since previsit or office visit. 

## 2022-03-11 NOTE — Patient Instructions (Signed)
Read all of the handouts given to you by your recovery room nurse.  Resume all of your previous medications.  AD AN ENDOSCOPIC PROCEDURE TODAY AT THE [facility]:   Refer to the procedure report that was given to you for any specific questions about what was found during the examination.  If the procedure report does not answer your questions, please call your gastroenterologist to clarify.  If you requested that your care partner not be given the details of your procedure findings, then the procedure report has been included in a sealed envelope for you to review at your convenience later.  YOU SHOULD EXPECT: Some feelings of bloating in the abdomen. Passage of more gas than usual.  Walking can help get rid of the air that was put into your GI tract during the procedure and reduce the bloating. If you had a lower endoscopy (such as a colonoscopy or flexible sigmoidoscopy) you may notice spotting of blood in your stool or on the toilet paper. If you underwent a bowel prep for your procedure, you may not have a normal bowel movement for a few days.  Please Note:  You might notice some irritation and congestion in your nose or some drainage.  This is from the oxygen used during your procedure.  There is no need for concern and it should clear up in a day or so.  SYMPTOMS TO REPORT IMMEDIATELY:  Following lower endoscopy (colonoscopy or flexible sigmoidoscopy):  Excessive amounts of blood in the stool  Significant tenderness or worsening of abdominal pains  Swelling of the abdomen that is new, acute  Fever of 100F or higher   For urgent or emergent issues, a gastroenterologist can be reached at any hour by calling [phone]. Do not use MyChart messaging for urgent concerns.    DIET:  We do recommend a small meal at first, but then you may proceed to your regular diet.  Drink plenty of fluids but you should avoid alcoholic beverages for 24 hours. Try to increase the fiber in your  diet, and drink plenty of water.  ACTIVITY:  You should plan to take it easy for the rest of today and you should NOT DRIVE or use heavy machinery until tomorrow (because of the sedation medicines used during the test).    FOLLOW UP: Our staff will call the number listed on your records the next business day following your procedure.  We will call around 7:15- 8:00 am to check on you and address any questions or concerns that you may have regarding the information given to you following your procedure. If we do not reach you, we will leave a message.     If any biopsies were taken you will be contacted by phone or by letter within the next 1-3 weeks.  Please call us at [phone] if you have not heard about the biopsies in 3 weeks.    SIGNATURES/CONFIDENTIALITY: You and/or your care partner have signed paperwork which will be entered into your electronic medical record.  These signatures attest to the fact that that the information above on your After Visit Summary has been reviewed and is understood.  Full responsibility of the confidentiality of this discharge information lies with you and/or your care-partner.

## 2022-03-11 NOTE — Op Note (Signed)
Patrick Willis: Patrick Willis Procedure Date: 03/11/2022 7:54 AM MRN: [id number] Endoscopist: Nicki Reaper E. Candis Schatz , MD, [id number] Age: 61 Referring MD:  Date of Birth: 02/03/1961 Gender: Male Account #: [id number] Procedure:                Colonoscopy Indications:              Screening for colorectal malignant neoplasm, Last                            colonoscopy: date unknown Medicines:                Monitored Anesthesia Care Procedure:                Pre-Anesthesia Assessment:                           - Prior to the procedure, a History and Physical                            was performed, and patient medications and                            allergies were reviewed. The patient's tolerance of                            previous anesthesia was also reviewed. The risks                            and benefits of the procedure and the sedation                            options and risks were discussed with the patient.                            All questions were answered, and informed consent                            was obtained. Prior Anticoagulants: The patient has                            taken no anticoagulant or antiplatelet agents. ASA                            Grade Assessment: II - A patient with mild systemic                            disease. After reviewing the risks and benefits,                            the patient was deemed in satisfactory condition to                            undergo the procedure.  After obtaining informed consent, the colonoscope                            was passed under direct vision. Throughout the                            procedure, the patient's blood pressure, pulse, and                            oxygen saturations were monitored continuously. The                            CF [id number] #[id number] was introduced through the anus                            and advanced to the the  cecum, identified by                            appendiceal orifice and ileocecal valve. The                            colonoscopy was performed without difficulty. The                            patient tolerated the procedure well. The quality                            of the bowel preparation was adequate. The                            ileocecal valve, appendiceal orifice, and rectum                            were photographed. The bowel preparation used was                            SUPREP via split dose instruction. Scope In: 8:07:12 AM Scope Out: 8:29:57 AM Scope Withdrawal Time: 0 hours 16 minutes 6 seconds  Total Procedure Duration: 0 hours 22 minutes 45 seconds  Findings:                 The perianal and digital rectal examinations were                            normal. Pertinent negatives include normal                            sphincter tone and no palpable rectal lesions.                           A 6 mm polyp was found in the ascending colon. The                            polyp was sessile. The polyp was removed  with a                            cold snare. Resection and retrieval were complete.                            Estimated blood loss was minimal.                           An 8 mm polyp was found in the hepatic flexure. The                            polyp was sessile. The polyp was removed with a                            cold snare. Resection and retrieval were complete.                            Estimated blood loss was minimal.                           Many large-mouthed and small-mouthed diverticula                            were found in the sigmoid colon, descending colon                            and transverse colon. There was narrowing of the                            colon in association with the diverticular opening.                            There was evidence of an impacted diverticulum.                            There was no evidence of  diverticular bleeding.                           The exam was otherwise normal throughout the                            examined colon.                           The retroflexed view of the distal rectum and anal                            verge was normal and showed no anal or rectal                            abnormalities. Complications:            No immediate complications. Estimated Blood Loss:     Estimated blood loss was minimal. Impression:               -  One 6 mm polyp in the ascending colon, removed                            with a cold snare. Resected and retrieved.                           - One 8 mm polyp at the hepatic flexure, removed                            with a cold snare. Resected and retrieved.                           - Severe diverticulosis in the sigmoid colon, in                            the descending colon and in the transverse colon.                            There was narrowing of the colon in association                            with the diverticular opening. There was evidence                            of an impacted diverticulum. There was no evidence                            of diverticular bleeding.                           - The distal rectum and anal verge are normal on                            retroflexion view. Recommendation:           - Patient has a contact number available for                            emergencies. The signs and symptoms of potential                            delayed complications were discussed with the                            patient. Return to normal activities tomorrow.                            Written discharge instructions were provided to the                            patient.                           - Resume previous diet.                           -  Continue present medications.                           - Await pathology results.                           - Repeat colonoscopy (date not yet  determined) for                            surveillance based on pathology results. Orlanda Lemmerman E. Candis Schatz, MD 03/11/2022 8:40:12 AM This report has been signed electronically.

## 2022-03-14 ENCOUNTER — Telehealth: Payer: Self-pay | Admitting: *Deleted

## 2022-03-14 ENCOUNTER — Telehealth: Payer: Self-pay | Admitting: Neurology

## 2022-03-14 NOTE — Telephone Encounter (Signed)
  Follow up Call-     03/11/2022    7:18 AM  Call back number  Post procedure Call Back phone  # [phone]  Permission to leave phone message Yes     Patient questions:  Do you have a fever, pain , or abdominal swelling? No. Pain Score  0 *  Have you tolerated food without any problems? Yes.    Have you been able to return to your normal activities? Yes.    Do you have any questions about your discharge instructions: Diet   No. Medications  No. Follow up visit  No.  Do you have questions or concerns about your Care? No.  Actions: * If pain score is 4 or above: No action needed, pain <4.

## 2022-03-14 NOTE — Telephone Encounter (Signed)
This patient saw Dr. Brett Fairy for sleep evaluation on 01/27/22.  I read the sleep study (with his oral appl in place) from 03/01/2022 on Dr. Edwena Felty behalf:  Please call patient and advise him that his sleep study with his oral appliance showed just borderline sleep apnea, fairly good control of sleep apnea with oral appliance in place, he can follow-up with his dentist if there is an adjustment needed as far as comfort of the appliance. He can follow-up with Dr. Brett Fairy as needed.

## 2022-03-16 NOTE — Progress Notes (Signed)
Patrick Willis,  The two polyps which I removed during your recent procedure were proven to be completely benign but are considered "pre-cancerous" polyps that MAY have grown into cancer if they had not been removed.  Studies shows that at least 20% of women over age 60 and 30% of men over age 62 have pre-cancerous polyps.  Based on current nationally recognized surveillance guidelines, I recommend that you have a repeat colonoscopy in 7 years.   If you develop any new rectal bleeding, abdominal pain or significant bowel habit changes, please contact me before then.  In the meantime, I recommend you consider a high-fiber diet or adding a daily fiber supplement such as Metamucil to reduce the risk of complications from your diverticulosis (diverticulitis or diverticular bleeding).

## 2022-04-29 ENCOUNTER — Encounter: Payer: Self-pay | Admitting: Family

## 2022-04-29 ENCOUNTER — Inpatient Hospital Stay (HOSPITAL_BASED_OUTPATIENT_CLINIC_OR_DEPARTMENT_OTHER): Payer: Managed Care, Other (non HMO) | Admitting: Family

## 2022-04-29 ENCOUNTER — Inpatient Hospital Stay: Payer: Managed Care, Other (non HMO) | Attending: Family

## 2022-04-29 VITALS — BP 160/93 | HR 46 | Temp 97.4°F | Resp 16 | Ht 70.0 in | Wt 201.1 lb

## 2022-04-29 DIAGNOSIS — D500 Iron deficiency anemia secondary to blood loss (chronic): Secondary | ICD-10-CM

## 2022-04-29 LAB — RETICULOCYTES
Immature Retic Fract: 10.6 % (ref 2.3–15.9)
RBC.: 5.16 MIL/uL (ref 4.22–5.81)
Retic Count, Absolute: 70.2 10*3/uL (ref 19.0–186.0)
Retic Ct Pct: 1.4 % (ref 0.4–3.1)

## 2022-04-29 LAB — CBC WITH DIFFERENTIAL (CANCER CENTER ONLY)
Abs Immature Granulocytes: 0.08 10*3/uL — ABNORMAL HIGH (ref 0.00–0.07)
Basophils Absolute: 0.1 10*3/uL (ref 0.0–0.1)
Basophils Relative: 1 %
Eosinophils Absolute: 0.1 10*3/uL (ref 0.0–0.5)
Eosinophils Relative: 2 %
HCT: 45.7 % (ref 39.0–52.0)
Hemoglobin: 14.9 g/dL (ref 13.0–17.0)
Immature Granulocytes: 1 %
Lymphocytes Relative: 22 %
Lymphs Abs: 1.5 10*3/uL (ref 0.7–4.0)
MCH: 28.5 pg (ref 26.0–34.0)
MCHC: 32.6 g/dL (ref 30.0–36.0)
MCV: 87.4 fL (ref 80.0–100.0)
Monocytes Absolute: 0.5 10*3/uL (ref 0.1–1.0)
Monocytes Relative: 7 %
Neutro Abs: 4.7 10*3/uL (ref 1.7–7.7)
Neutrophils Relative %: 67 %
Platelet Count: 229 10*3/uL (ref 150–400)
RBC: 5.23 MIL/uL (ref 4.22–5.81)
RDW: 17.4 % — ABNORMAL HIGH (ref 11.5–15.5)
WBC Count: 6.9 10*3/uL (ref 4.0–10.5)
nRBC: 0 % (ref 0.0–0.2)

## 2022-04-29 LAB — IRON AND IRON BINDING CAPACITY (CC-WL,HP ONLY)
Iron: 164 ug/dL (ref 45–182)
Saturation Ratios: 41 % — ABNORMAL HIGH (ref 17.9–39.5)
TIBC: 398 ug/dL (ref 250–450)
UIBC: 234 ug/dL (ref 117–376)

## 2022-04-29 LAB — FERRITIN: Ferritin: 13 ng/mL — ABNORMAL LOW (ref 24–336)

## 2022-04-29 NOTE — Progress Notes (Signed)
Hematology and Oncology Follow Up Visit  YAKIR WENKE [id number] 12/09/1960 62 y.o. 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 Mccalla is here today for follow-up. He is doing well and has no complaints at this time.  He is tolerating the daily oral iron supplement nicely.  No blood loss, bruising or petechiae.  No fever, chills, n/v, cough, rash, dizziness, SOB, chest pain, palpitations, abdominal pain or changes in bowel or bladder habits.  No swelling, tenderness, numbness or tingling in her extremities.  No falls or syncope.  Appetite and hydration are good. Weight is stable at 207 lbs.   ECOG Performance Status: 1 - Symptomatic but completely ambulatory  Medications:  Allergies as of 04/29/2022   No Known Allergies      Medication List        Accurate as of April 29, 2022 10:33 AM. If you have any questions, ask your nurse or doctor.          ferrous gluconate 324 MG tablet Commonly known as: FERGON Take 1 tablet (324 mg total) by mouth daily with breakfast.   multivitamin with minerals tablet Take 1 tablet by mouth daily.        Allergies: No Known Allergies  Past Medical History, Surgical history, Social history, and Family History were reviewed and updated.  Review of Systems: All other 10 point review of systems is negative.   Physical Exam:  vitals were not taken for this visit.   Wt Readings from Last 3 Encounters:  03/11/22 211 lb 3.2 oz (95.8 kg)  02/07/22 211 lb 3.2 oz (95.8 kg)  01/28/22 209 lb (94.8 kg)    Ocular: Sclerae unicteric, pupils equal, round and reactive to light Ear-nose-throat: Oropharynx clear, dentition fair Lymphatic: No cervical or supraclavicular adenopathy Lungs no rales or rhonchi, good excursion bilaterally Heart regular rate and rhythm, no murmur appreciated Abd soft,  nontender, positive bowel sounds MSK no focal spinal tenderness, no joint edema Neuro: non-focal, well-oriented, appropriate affect Breasts: Deferred   Lab Results  Component Value Date   WBC 6.3 01/28/2022   HGB 11.4 (L) 01/28/2022   HCT 37.4 (L) 01/28/2022   MCV 78.6 (L) 01/28/2022   PLT 282 01/28/2022   Lab Results  Component Value Date   FERRITIN 3 (L) 01/28/2022   IRON 37 (L) 01/28/2022   TIBC 501 (H) 01/28/2022   UIBC 464 (H) 01/28/2022   IRONPCTSAT 7 (L) 01/28/2022   Lab Results  Component Value Date   RETICCTPCT 1.4 01/28/2022   RBC 4.76 01/28/2022   RBC 4.80 01/28/2022   No results found for: "KPAFRELGTCHN", "LAMBDASER", "KAPLAMBRATIO" No results found for: "IGGSERUM", "IGA", "IGMSERUM" No results found for: "TOTALPROTELP", "ALBUMINELP", "A1GS", "A2GS", "BETS", "BETA2SER", "GAMS", "MSPIKE", "SPEI"   Chemistry      Component Value Date/Time   NA 139 01/28/2022 0850   NA 140 10/24/2019 0948   K 4.1 01/28/2022 0850   CL 106 01/28/2022 0850   CO2 26 01/28/2022 0850   BUN 17 01/28/2022 0850   BUN 18 10/24/2019 0948   CREATININE 0.83 01/28/2022 0850   CREATININE 0.78 09/03/2015 1047      Component Value Date/Time   CALCIUM 9.4 01/28/2022 0850   ALKPHOS 54 01/28/2022 0850   AST 13 (L) 01/28/2022 0850   ALT 13 01/28/2022 0850   BILITOT 0.5 01/28/2022 0850  Impression and Plan: Mr. Viegas is a very pleasant 62 yo caucasian gentleman with recent mild microcytic anemia felt to be secondary to frequent (every 60 days) donation with the [facility].  Iron studies are pending. Hgb is much improved at 14.9, MCV 87.  He will reduce his blood donation to 34 times a year and continue his daily oral iron supplement.  Follow-up in 1 year.   Lottie Dawson, NP 2/2/202410:33 AM

## 2022-04-30 ENCOUNTER — Other Ambulatory Visit: Payer: Self-pay | Admitting: Family

## 2022-04-30 DIAGNOSIS — D500 Iron deficiency anemia secondary to blood loss (chronic): Secondary | ICD-10-CM

## 2022-10-11 ENCOUNTER — Encounter: Payer: Self-pay | Admitting: Neurology

## 2022-10-18 ENCOUNTER — Encounter: Payer: Self-pay | Admitting: Neurology

## 2022-10-18 DIAGNOSIS — G4733 Obstructive sleep apnea (adult) (pediatric): Secondary | ICD-10-CM

## 2022-10-19 ENCOUNTER — Telehealth: Payer: Self-pay | Admitting: Neurology

## 2022-10-19 NOTE — Telephone Encounter (Signed)
Referral for dentistry faxed to [facility]. Phone:[phone], Fax: [phone]

## 2022-10-19 NOTE — Telephone Encounter (Signed)
Form to be signed by neurologist for Referral for dentistry with Dr. Myrtis Ser has been given to the nurse for signature.

## 2022-10-25 ENCOUNTER — Other Ambulatory Visit: Payer: Self-pay | Admitting: Hematology & Oncology

## 2022-10-25 DIAGNOSIS — D500 Iron deficiency anemia secondary to blood loss (chronic): Secondary | ICD-10-CM

## 2023-01-11 ENCOUNTER — Encounter: Payer: Self-pay | Admitting: Family Medicine

## 2023-01-11 ENCOUNTER — Ambulatory Visit: Payer: Managed Care, Other (non HMO) | Admitting: Family Medicine

## 2023-01-11 VITALS — BP 124/78 | HR 50 | Temp 97.9°F | Ht 70.0 in | Wt 200.4 lb

## 2023-01-11 DIAGNOSIS — D649 Anemia, unspecified: Secondary | ICD-10-CM

## 2023-01-11 DIAGNOSIS — Z23 Encounter for immunization: Secondary | ICD-10-CM

## 2023-01-11 DIAGNOSIS — Z125 Encounter for screening for malignant neoplasm of prostate: Secondary | ICD-10-CM | POA: Diagnosis not present

## 2023-01-11 DIAGNOSIS — E7800 Pure hypercholesterolemia, unspecified: Secondary | ICD-10-CM

## 2023-01-11 DIAGNOSIS — R7303 Prediabetes: Secondary | ICD-10-CM | POA: Diagnosis not present

## 2023-01-11 DIAGNOSIS — Z0000 Encounter for general adult medical examination without abnormal findings: Secondary | ICD-10-CM

## 2023-01-11 LAB — LIPID PANEL
Cholesterol: 173 mg/dL (ref 0–200)
HDL: 58 mg/dL (ref >39.00)
LDL Cholesterol: 97 mg/dL (ref 0–99)
NonHDL: 114.81
Total CHOL/HDL Ratio: 3
Triglycerides: 88 mg/dL (ref 0.0–149.0)
VLDL: 17.6 mg/dL (ref 0.0–40.0)

## 2023-01-11 LAB — CBC
HCT: 45.9 % (ref 39.0–52.0)
Hemoglobin: 15 g/dL (ref 13.0–17.0)
MCHC: 32.7 g/dL (ref 30.0–36.0)
MCV: 93.8 fL (ref 78.0–100.0)
Platelets: 204 10*3/uL (ref 150.0–400.0)
RBC: 4.89 Mil/uL (ref 4.22–5.81)
RDW: 13.3 % (ref 11.5–15.5)
WBC: 6.1 10*3/uL (ref 4.0–10.5)

## 2023-01-11 LAB — COMPREHENSIVE METABOLIC PANEL
ALT: 14 U/L (ref 0–53)
AST: 14 U/L (ref 0–37)
Albumin: 4.5 g/dL (ref 3.5–5.2)
Alkaline Phosphatase: 61 U/L (ref 39–117)
BUN: 15 mg/dL (ref 6–23)
CO2: 30 meq/L (ref 19–32)
Calcium: 9.7 mg/dL (ref 8.4–10.5)
Chloride: 104 meq/L (ref 96–112)
Creatinine, Ser: 0.83 mg/dL (ref 0.40–1.50)
GFR: 94.43 mL/min (ref >60.00)
Glucose, Bld: 110 mg/dL — ABNORMAL HIGH (ref 70–99)
Potassium: 4.6 meq/L (ref 3.5–5.1)
Sodium: 139 meq/L (ref 135–145)
Total Bilirubin: 0.7 mg/dL (ref 0.2–1.2)
Total Protein: 6.7 g/dL (ref 6.0–8.3)

## 2023-01-11 LAB — HEMOGLOBIN A1C: Hgb A1c MFr Bld: 5.5 % (ref 4.6–6.5)

## 2023-01-11 LAB — PSA: PSA: 0.92 ng/mL (ref 0.10–4.00)

## 2023-01-11 NOTE — Patient Instructions (Signed)
Thank you for coming in today.  I will check screening labs and let you know if there are any concerns.  Take care.   Preventive Care 70-61 Years Old, Male Preventive care refers to lifestyle choices and visits with your health care provider that can promote health and wellness. Preventive care visits are also called wellness exams. What can I expect for my preventive care visit? Counseling During your preventive care visit, your health care provider may ask about your: Medical history, including: Past medical problems. Family medical history. Current health, including: Emotional well-being. Home life and relationship well-being. Sexual activity. Lifestyle, including: Alcohol, nicotine or tobacco, and drug use. Access to firearms. Diet, exercise, and sleep habits. Safety issues such as seatbelt and bike helmet use. Sunscreen use. Work and work Astronomer. Physical exam Your health care provider will check your: Height and weight. These may be used to calculate your BMI (body mass index). BMI is a measurement that tells if you are at a healthy weight. Waist circumference. This measures the distance around your waistline. This measurement also tells if you are at a healthy weight and may help predict your risk of certain diseases, such as type 2 diabetes and high blood pressure. Heart rate and blood pressure. Body temperature. Skin for abnormal spots. What immunizations do I need?  Vaccines are usually given at various ages, according to a schedule. Your health care provider will recommend vaccines for you based on your age, medical history, and lifestyle or other factors, such as travel or where you work. What tests do I need? Screening Your health care provider may recommend screening tests for certain conditions. This may include: Lipid and cholesterol levels. Diabetes screening. This is done by checking your blood sugar (glucose) after you have not eaten for a while  (fasting). Hepatitis B test. Hepatitis C test. HIV (human immunodeficiency virus) test. STI (sexually transmitted infection) testing, if you are at risk. Lung cancer screening. Prostate cancer screening. Colorectal cancer screening. Talk with your health care provider about your test results, treatment options, and if necessary, the need for more tests. Follow these instructions at home: Eating and drinking  Eat a diet that includes fresh fruits and vegetables, whole grains, lean protein, and low-fat dairy products. Take vitamin and mineral supplements as recommended by your health care provider. Do not drink alcohol if your health care provider tells you not to drink. If you drink alcohol: Limit how much you have to 0-2 drinks a day. Know how much alcohol is in your drink. In the [place] one drink equals one 12 oz bottle of beer (355 mL), one 5 oz glass of wine (148 mL), or one 1 oz glass of hard liquor (44 mL). Lifestyle Brush your teeth every morning and night with fluoride toothpaste. Floss one time each day. Exercise for at least 30 minutes 5 or more days each week. Do not use any products that contain nicotine or tobacco. These products include cigarettes, chewing tobacco, and vaping devices, such as e-cigarettes. If you need help quitting, ask your health care provider. Do not use drugs. If you are sexually active, practice safe sex. Use a condom or other form of protection to prevent STIs. Take aspirin only as told by your health care provider. Make sure that you understand how much to take and what form to take. Work with your health care provider to find out whether it is safe and beneficial for you to take aspirin daily. Find healthy ways to manage stress, such  as: Meditation, yoga, or listening to music. Journaling. Talking to a trusted person. Spending time with friends and family. Minimize exposure to UV radiation to reduce your risk of skin cancer. Safety Always wear  your seat belt while driving or riding in a vehicle. Do not drive: If you have been drinking alcohol. Do not ride with someone who has been drinking. When you are tired or distracted. While texting. If you have been using any mind-altering substances or drugs. Wear a helmet and other protective equipment during sports activities. If you have firearms in your house, make sure you follow all gun safety procedures. What's next? Go to your health care provider once a year for an annual wellness visit. Ask your health care provider how often you should have your eyes and teeth checked. Stay up to date on all vaccines. This information is not intended to replace advice given to you by your health care provider. Make sure you discuss any questions you have with your health care provider. Document Revised: 09/09/2020 Document Reviewed: 09/09/2020 Elsevier Patient Education  2024 [employer].

## 2023-01-11 NOTE — Progress Notes (Signed)
Subjective:  Patient ID: Patrick Willis, male    DOB: February 13, 1961  Age: 62 y.o. MRN: [id number]  CC:  Chief Complaint  Patient presents with   Annual Exam    Pt is doing well, pt is fasting     HPI [name] presents for Annual Exam PCP, me.  Dermatology, Dr. Sharyn Lull Sleep specialist, Dr. Vickey Huger - sleep study last year with borderline sleep apnea, use of oral appliance. Hematology, visit with Eileen Stanford, NP in February.  Mild microcytic anemia thought to be due to frequent donation of blood.  Recommended decreasing to 3-4 times per year with daily oral iron supplementation.  1 year follow-up planned. Donating 4 times per year, taking iron supplement, no melena/hematochezia.  Lab Results  Component Value Date   WBC 6.9 04/29/2022   HGB 14.9 04/29/2022   HCT 45.7 04/29/2022   MCV 87.4 04/29/2022   PLT 229 04/29/2022    Prediabetes: Diet/exercise approach, weight improved from last year.  Mild elevated LDL last year.  No current meds. No vision change, urinary changes, or change in thirst.   Wt 206 at last physical  Lab Results  Component Value Date   HGBA1C 6.2 01/05/2022   Wt Readings from Last 3 Encounters:  01/11/23 200 lb 6.4 oz (90.9 kg)  04/29/22 201 lb 1.9 oz (91.2 kg)  03/11/22 211 lb 3.2 oz (95.8 kg)    Lab Results  Component Value Date   CHOL 182 01/05/2022   HDL 57.80 01/05/2022   LDLCALC 107 (H) 01/05/2022   TRIG 85.0 01/05/2022   CHOLHDL 3 01/05/2022        01/11/2023    8:11 AM 01/05/2022    8:17 AM 07/01/2021    3:13 PM 12/28/2020    2:13 PM 10/28/2019    1:20 PM  Depression screen PHQ 2/9  Decreased Interest 0 0 0 0 0  Down, Depressed, Hopeless 0 0 0 0 0  PHQ - 2 Score 0 0 0 0 0  Altered sleeping 0  0    Tired, decreased energy 1  1    Change in appetite 0  0    Feeling bad or failure about yourself  0  0    Trouble concentrating 0  0    Moving slowly or fidgety/restless 0  0    Suicidal thoughts 0  0    PHQ-9 Score 1  1       Health Maintenance  Topic Date Due   INFLUENZA VACCINE  10/27/2022   COVID-19 Vaccine (4 - 2023-24 season) 11/27/2022   DTaP/Tdap/Td (2 - Td or Tdap) 10/25/2027   Colonoscopy  03/11/2029   Hepatitis C Screening  Completed   HIV Screening  Completed   Zoster Vaccines- Shingrix  Completed   HPV VACCINES  Aged Out  Colonoscopy 03/11/22. Grandmother with colon CA.  Prostate: does not have family history of prostate cancer The natural history of prostate cancer and ongoing controversy regarding screening and potential treatment outcomes of prostate cancer has been discussed with the patient. The meaning of a false positive PSA and a false negative PSA has been discussed. He indicates understanding of the limitations of this screening test and wishes to proceed with screening PSA testing. Lab Results  Component Value Date   PSA1 0.8 10/24/2019   PSA1 0.8 11/20/2018   PSA1 0.8 10/24/2017   PSA 0.67 01/05/2022   PSA 1.03 12/23/2020   PSA 0.91 09/03/2015     Immunization History  Administered Date(s) Administered   Influenza,inj,Quad PF,6+ Mos 12/23/2016, 01/28/2018, 11/21/2018, 12/28/2020, 01/05/2022   Influenza-Unspecified 01/28/2018   Moderna Sars-Covid-2 Vaccination 06/13/2019, 07/11/2019, 03/10/2020   Tdap 10/24/2017   Zoster Recombinant(Shingrix) 10/24/2017, 01/28/2018  Flu vaccine today.  Covid booster - declines. No recent infection.  RSV vaccine - discussed, declined.   No results found. Optho few times per year.  Cupping, monitored, no recent changes.   Dental: every 6 months.   Alcohol: 4-5 per week.   Tobacco: none.   Exercise: tennis, lawncare, golf. 10k steps/day.    History Patient Active Problem List   Diagnosis Date Noted   Recurrent ventral incisional hernia 03/12/2021   Recurrent ventral hernia with incarceration 03/07/2021   Splenic mass 01/28/2021   History of ventral hernia repair 12/22/2020   Elevated systolic blood pressure reading without  diagnosis of hypertension 11/25/2019   Sinus bradycardia by electrocardiogram 11/22/2019   First degree AV block 11/22/2019   Past Medical History:  Diagnosis Date   Anemia    GERD (gastroesophageal reflux disease)    Hypertension    Phreesia 10/28/2019   Sinus bradycardia on ECG    Rates in 40s and 50s at rest-with 1 AVB   Sleep apnea    MILD,WEAR MOUTH GUARD   Past Surgical History:  Procedure Laterality Date   HERNIA REPAIR     INCISIONAL HERNIA REPAIR N/A 03/12/2021   Procedure: LAPAROSCOPIC VENTRAL INCISIONAL HERNIA REPAIR;  Surgeon: Darnell Level, MD;  Location: [facility];  Service: General;  Laterality: N/A;   INGUINAL HERNIA REPAIR     INSERTION OF MESH  03/12/2021   Procedure: INSERTION OF MESH;  Surgeon: Darnell Level, MD;  Location: [facility];  Service: General;;   WISDOM TOOTH EXTRACTION     No Known Allergies Prior to Admission medications   Medication Sig Start Date End Date Taking? Authorizing Provider  ferrous gluconate (FERGON) 324 MG tablet TAKE 1 TABLET BY MOUTH EVERY DAY WITH BREAKFAST 10/25/22  Yes Ennever, Rose Phi, MD  Multiple Vitamins-Minerals (MULTIVITAMIN WITH MINERALS) tablet Take 1 tablet by mouth daily.   Yes [provider]   Social History   Socioeconomic History   Marital status: Married    Spouse name: Not on file   Number of children: 3   Years of education: Not on file   Highest education level: Not on file  Occupational History   Occupation: SALES REP  Tobacco Use   Smoking status: Never    Passive exposure: Never   Smokeless tobacco: Never  Vaping Use   Vaping status: Never Used  Substance and Sexual Activity   Alcohol use: Yes    Alcohol/week: 4.0 - 5.0 standard drinks of alcohol    Types: 4 - 5 Cans of beer per week    Comment: OCCASSIONAL   Drug use: No   Sexual activity: Yes  Other Topics Concern   Not on file  Social History Narrative   Exercise walking x 2 miles four times/wk      He and his family just returned  from a trip to [place].  His twin daughters have been taking part in a high school summer abroad program in [place].  Their trip started in [place] and encompass much the [place] of [place] including [place] and [place]   Social Determinants of Health   Financial Resource Strain: Not on [employer] Insecurity: Not on file  Transportation Needs: Not on file  Physical Activity: Not on file  Stress: Not on file  Social Connections: Not on file  Intimate Partner Violence: Not on file    Review of Systems 13 point review of systems per patient health survey noted.  Negative other than as indicated above or in HPI.    Objective:   Vitals:   01/11/23 0813  BP: 124/78  Pulse: (!) 50  Temp: 97.9 F (36.6 C)  TempSrc: Temporal  SpO2: 98%  Weight: 200 lb 6.4 oz (90.9 kg)  Height: 5\' 10"  (1.778 m)     Physical Exam Vitals reviewed.  Constitutional:      Appearance: He is well-developed.  HENT:     Head: Normocephalic and atraumatic.     Right Ear: External ear normal.     Left Ear: External ear normal.  Eyes:     Conjunctiva/sclera: Conjunctivae normal.     Pupils: Pupils are equal, round, and reactive to light.  Neck:     Thyroid: No thyromegaly.  Cardiovascular:     Rate and Rhythm: Normal rate and regular rhythm.     Heart sounds: Normal heart sounds.  Pulmonary:     Effort: Pulmonary effort is normal. No respiratory distress.     Breath sounds: Normal breath sounds. No wheezing.  Abdominal:     General: There is no distension.     Palpations: Abdomen is soft.     Tenderness: There is no abdominal tenderness.  Musculoskeletal:        General: No tenderness. Normal range of motion.     Cervical back: Normal range of motion and neck supple.  Lymphadenopathy:     Cervical: No cervical adenopathy.  Skin:    General: Skin is warm and dry.  Neurological:     Mental Status: He is alert and oriented to person, place, and time.     Deep Tendon Reflexes: Reflexes are  normal and symmetric.  Psychiatric:        Behavior: Behavior normal.        Assessment & Plan:  KAMORI BARBIER is a 62 y.o. male . Annual physical exam  - -anticipatory guidance as below in AVS, screening labs above. Health maintenance items as above in HPI discussed/recommended as applicable.   Prediabetes - Plan: Comp Met (CMET), Hemoglobin A1c  -Check A1c, continue to watch diet, exercise, FITT principle discussed.  Elevated LDL cholesterol level - Plan: Lipid panel  -Check labs, adjust plan accordingly.  Anemia, unspecified type - Plan: CBC  -Likely due to blood donation.  Continue on iron, check updated CBC.  Needs flu shot - Plan: Flu vaccine trivalent PF, 6mos and older(Flulaval,Afluria,Fluarix,Fluzone)  Screening for prostate cancer - Plan: PSA   No orders of the defined types were placed in this encounter.  Patient Instructions  Thank you for coming in today.  I will check screening labs and let you know if there are any concerns.  Take care.   Preventive Care 13-46 Years Old, Male Preventive care refers to lifestyle choices and visits with your health care provider that can promote health and wellness. Preventive care visits are also called wellness exams. What can I expect for my preventive care visit? Counseling During your preventive care visit, your health care provider may ask about your: Medical history, including: Past medical problems. Family medical history. Current health, including: Emotional well-being. Home life and relationship well-being. Sexual activity. Lifestyle, including: Alcohol, nicotine or tobacco, and drug use. Access to firearms. Diet, exercise, and sleep habits. Safety issues such as seatbelt and bike helmet use. Sunscreen use. Work  and work environment. Physical exam Your health care provider will check your: Height and weight. These may be used to calculate your BMI (body mass index). BMI is a measurement that tells if you  are at a healthy weight. Waist circumference. This measures the distance around your waistline. This measurement also tells if you are at a healthy weight and may help predict your risk of certain diseases, such as type 2 diabetes and high blood pressure. Heart rate and blood pressure. Body temperature. Skin for abnormal spots. What immunizations do I need?  Vaccines are usually given at various ages, according to a schedule. Your health care provider will recommend vaccines for you based on your age, medical history, and lifestyle or other factors, such as travel or where you work. What tests do I need? Screening Your health care provider may recommend screening tests for certain conditions. This may include: Lipid and cholesterol levels. Diabetes screening. This is done by checking your blood sugar (glucose) after you have not eaten for a while (fasting). Hepatitis B test. Hepatitis C test. HIV (human immunodeficiency virus) test. STI (sexually transmitted infection) testing, if you are at risk. Lung cancer screening. Prostate cancer screening. Colorectal cancer screening. Talk with your health care provider about your test results, treatment options, and if necessary, the need for more tests. Follow these instructions at home: Eating and drinking  Eat a diet that includes fresh fruits and vegetables, whole grains, lean protein, and low-fat dairy products. Take vitamin and mineral supplements as recommended by your health care provider. Do not drink alcohol if your health care provider tells you not to drink. If you drink alcohol: Limit how much you have to 0-2 drinks a day. Know how much alcohol is in your drink. In the [place] one drink equals one 12 oz bottle of beer (355 mL), one 5 oz glass of wine (148 mL), or one 1 oz glass of hard liquor (44 mL). Lifestyle Brush your teeth every morning and night with fluoride toothpaste. Floss one time each day. Exercise for at least 30  minutes 5 or more days each week. Do not use any products that contain nicotine or tobacco. These products include cigarettes, chewing tobacco, and vaping devices, such as e-cigarettes. If you need help quitting, ask your health care provider. Do not use drugs. If you are sexually active, practice safe sex. Use a condom or other form of protection to prevent STIs. Take aspirin only as told by your health care provider. Make sure that you understand how much to take and what form to take. Work with your health care provider to find out whether it is safe and beneficial for you to take aspirin daily. Find healthy ways to manage stress, such as: Meditation, yoga, or listening to music. Journaling. Talking to a trusted person. Spending time with friends and family. Minimize exposure to UV radiation to reduce your risk of skin cancer. Safety Always wear your seat belt while driving or riding in a vehicle. Do not drive: If you have been drinking alcohol. Do not ride with someone who has been drinking. When you are tired or distracted. While texting. If you have been using any mind-altering substances or drugs. Wear a helmet and other protective equipment during sports activities. If you have firearms in your house, make sure you follow all gun safety procedures. What's next? Go to your health care provider once a year for an annual wellness visit. Ask your health care provider how often you should have  your eyes and teeth checked. Stay up to date on all vaccines. This information is not intended to replace advice given to you by your health care provider. Make sure you discuss any questions you have with your health care provider. Document Revised: 09/09/2020 Document Reviewed: 09/09/2020 Elsevier Patient Education  2024 Elsevier Inc.       Signed,   Meredith Staggers, MD [facility], [facility] 01/11/23 8:38 AM

## 2023-05-01 ENCOUNTER — Other Ambulatory Visit: Payer: Self-pay | Admitting: Family

## 2023-05-01 ENCOUNTER — Ambulatory Visit: Payer: Managed Care, Other (non HMO) | Admitting: Family

## 2023-05-01 ENCOUNTER — Inpatient Hospital Stay: Payer: Managed Care, Other (non HMO)

## 2023-05-01 DIAGNOSIS — D500 Iron deficiency anemia secondary to blood loss (chronic): Secondary | ICD-10-CM

## 2023-05-02 ENCOUNTER — Inpatient Hospital Stay: Payer: Managed Care, Other (non HMO) | Attending: Family

## 2023-05-02 ENCOUNTER — Inpatient Hospital Stay (HOSPITAL_BASED_OUTPATIENT_CLINIC_OR_DEPARTMENT_OTHER): Payer: Managed Care, Other (non HMO) | Admitting: Family

## 2023-05-02 ENCOUNTER — Encounter: Payer: Self-pay | Admitting: Family

## 2023-05-02 VITALS — BP 146/89 | HR 48 | Temp 98.4°F | Resp 17 | Ht 70.0 in | Wt 206.0 lb

## 2023-05-02 DIAGNOSIS — D500 Iron deficiency anemia secondary to blood loss (chronic): Secondary | ICD-10-CM | POA: Insufficient documentation

## 2023-05-02 LAB — CBC WITH DIFFERENTIAL (CANCER CENTER ONLY)
Abs Immature Granulocytes: 0.05 10*3/uL (ref 0.00–0.07)
Basophils Absolute: 0.1 10*3/uL (ref 0.0–0.1)
Basophils Relative: 1 %
Eosinophils Absolute: 0.1 10*3/uL (ref 0.0–0.5)
Eosinophils Relative: 2 %
HCT: 46.1 % (ref 39.0–52.0)
Hemoglobin: 15.5 g/dL (ref 13.0–17.0)
Immature Granulocytes: 1 %
Lymphocytes Relative: 24 %
Lymphs Abs: 1.7 10*3/uL (ref 0.7–4.0)
MCH: 30.8 pg (ref 26.0–34.0)
MCHC: 33.6 g/dL (ref 30.0–36.0)
MCV: 91.7 fL (ref 80.0–100.0)
Monocytes Absolute: 0.4 10*3/uL (ref 0.1–1.0)
Monocytes Relative: 6 %
Neutro Abs: 5 10*3/uL (ref 1.7–7.7)
Neutrophils Relative %: 66 %
Platelet Count: 217 10*3/uL (ref 150–400)
RBC: 5.03 MIL/uL (ref 4.22–5.81)
RDW: 12.4 % (ref 11.5–15.5)
WBC Count: 7.4 10*3/uL (ref 4.0–10.5)
nRBC: 0 % (ref 0.0–0.2)

## 2023-05-02 LAB — IRON AND IRON BINDING CAPACITY (CC-WL,HP ONLY)
Iron: 112 ug/dL (ref 45–182)
Saturation Ratios: 29 % (ref 17.9–39.5)
TIBC: 385 ug/dL (ref 250–450)
UIBC: 273 ug/dL (ref 117–376)

## 2023-05-02 LAB — RETICULOCYTES
Immature Retic Fract: 13.1 % (ref 2.3–15.9)
RBC.: 4.97 MIL/uL (ref 4.22–5.81)
Retic Count, Absolute: 65.1 10*3/uL (ref 19.0–186.0)
Retic Ct Pct: 1.3 % (ref 0.4–3.1)

## 2023-05-02 LAB — CMP (CANCER CENTER ONLY)
ALT: 15 U/L (ref 0–44)
AST: 14 U/L — ABNORMAL LOW (ref 15–41)
Albumin: 4.6 g/dL (ref 3.5–5.0)
Alkaline Phosphatase: 55 U/L (ref 38–126)
Anion gap: 8 (ref 5–15)
BUN: 14 mg/dL (ref 8–23)
CO2: 29 mmol/L (ref 22–32)
Calcium: 9.7 mg/dL (ref 8.9–10.3)
Chloride: 104 mmol/L (ref 98–111)
Creatinine: 0.83 mg/dL (ref 0.61–1.24)
GFR, Estimated: >60 mL/min (ref >60)
Glucose, Bld: 92 mg/dL (ref 70–99)
Potassium: 4.6 mmol/L (ref 3.5–5.1)
Sodium: 141 mmol/L (ref 135–145)
Total Bilirubin: 0.6 mg/dL (ref 0.0–1.2)
Total Protein: 7.1 g/dL (ref 6.5–8.1)

## 2023-05-02 LAB — FERRITIN: Ferritin: 27 ng/mL (ref 24–336)

## 2023-05-02 NOTE — Progress Notes (Signed)
 Hematology and Oncology Follow Up Visit  JANIEL CRISOSTOMO [id number] 02/24/1961 63 y.o. 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 Scalera is here today for annual follow-up. He is doing well but notes mild fatigue in the afternoons. This resolves with taking a moment to close his eyes and rest.  CBC with diff is stable.  No issues with blood loss. He has donated 5 times in the last year.  He has been taking his oral iron supplement once a day.  No fever, chills, n/v, cough, rash, dizziness, SOB, chest pain, palpitations, abdominal pain or changes in bowel or bladder habits.  No swelling, tenderness, numbness or tingling in his extremities.  No falls or syncope.  Appetite and hydration are good. Weight is stable at 206 lbs.   ECOG Performance Status: 0 - Asymptomatic  Medications:  Allergies as of 05/02/2023   No Known Allergies      Medication List        Accurate as of May 02, 2023  1:22 PM. If you have any questions, ask your nurse or doctor.          ferrous gluconate  324 MG tablet Commonly known as: FERGON TAKE 1 TABLET BY MOUTH EVERY DAY WITH BREAKFAST   multivitamin with minerals tablet Take 1 tablet by mouth daily.        Allergies: No Known Allergies  Past Medical History, Surgical history, Social history, and Family History were reviewed and updated.  Review of Systems: All other 10 point review of systems is negative.   Physical Exam:  vitals were not taken for this visit.   Wt Readings from Last 3 Encounters:  01/11/23 200 lb 6.4 oz (90.9 kg)  04/29/22 201 lb 1.9 oz (91.2 kg)  03/11/22 211 lb 3.2 oz (95.8 kg)    Ocular: Sclerae unicteric, pupils equal, round and reactive to light Ear-nose-throat: Oropharynx clear, dentition fair Lymphatic: No cervical or supraclavicular adenopathy Lungs no rales or  rhonchi, good excursion bilaterally Heart regular rate and rhythm, no murmur appreciated Abd soft, nontender, positive bowel sounds MSK no focal spinal tenderness, no joint edema Neuro: non-focal, well-oriented, appropriate affect Breasts: Deferred   Lab Results  Component Value Date   WBC 7.4 05/02/2023   HGB 15.5 05/02/2023   HCT 46.1 05/02/2023   MCV 91.7 05/02/2023   PLT 217 05/02/2023   Lab Results  Component Value Date   FERRITIN 13 (L) 04/29/2022   IRON 164 04/29/2022   TIBC 398 04/29/2022   UIBC 234 04/29/2022   IRONPCTSAT 41 (H) 04/29/2022   Lab Results  Component Value Date   RETICCTPCT 1.3 05/02/2023   RBC 5.03 05/02/2023   RBC 4.97 05/02/2023   No results found for: KPAFRELGTCHN, LAMBDASER, KAPLAMBRATIO No results found for: IGGSERUM, IGA, IGMSERUM No results found for: STEPHANY CARLOTA BENSON MARKEL EARLA JOANNIE DOC VICK, SPEI   Chemistry      Component Value Date/Time   NA 139 01/11/2023 0842   NA 140 10/24/2019 0948   K 4.6 01/11/2023 0842   CL 104 01/11/2023 0842   CO2 30 01/11/2023 0842   BUN 15 01/11/2023 0842   BUN 18 10/24/2019 0948   CREATININE 0.83 01/11/2023 0842   CREATININE 0.83 01/28/2022 0850   CREATININE 0.78 09/03/2015 1047      Component  Value Date/Time   CALCIUM 9.7 01/11/2023 0842   ALKPHOS 61 01/11/2023 0842   AST 14 01/11/2023 0842   AST 13 (L) 01/28/2022 0850   ALT 14 01/11/2023 0842   ALT 13 01/28/2022 0850   BILITOT 0.7 01/11/2023 0842   BILITOT 0.5 01/28/2022 0850       Impression and Plan: Mr. Gehring is a very pleasant 63 yo caucasian gentleman with history of mild microcytic anemia secondary to frequent (every 60 days) donation with [facility].  Iron studies are pending.  He will reduce his blood donation to 3-4 times a year and continue his daily oral iron supplement.  His Hgb has returned to normal and has remained consistent.  Follow-up now as needed.   Lauraine Pepper, NP 2/4/20251:22 PM

## 2024-01-12 ENCOUNTER — Encounter: Payer: Self-pay | Admitting: Family Medicine

## 2024-01-12 ENCOUNTER — Ambulatory Visit: Payer: Managed Care, Other (non HMO) | Admitting: Family Medicine

## 2024-01-12 VITALS — BP 130/88 | HR 50 | Temp 97.5°F | Resp 15 | Ht 70.0 in | Wt 205.0 lb

## 2024-01-12 DIAGNOSIS — E7800 Pure hypercholesterolemia, unspecified: Secondary | ICD-10-CM | POA: Diagnosis not present

## 2024-01-12 DIAGNOSIS — Z125 Encounter for screening for malignant neoplasm of prostate: Secondary | ICD-10-CM

## 2024-01-12 DIAGNOSIS — D649 Anemia, unspecified: Secondary | ICD-10-CM

## 2024-01-12 DIAGNOSIS — Z0000 Encounter for general adult medical examination without abnormal findings: Secondary | ICD-10-CM

## 2024-01-12 DIAGNOSIS — R7303 Prediabetes: Secondary | ICD-10-CM | POA: Diagnosis not present

## 2024-01-12 DIAGNOSIS — Z23 Encounter for immunization: Secondary | ICD-10-CM | POA: Diagnosis not present

## 2024-01-12 LAB — COMPREHENSIVE METABOLIC PANEL WITH GFR
ALT: 14 U/L (ref 0–53)
AST: 13 U/L (ref 0–37)
Albumin: 4.5 g/dL (ref 3.5–5.2)
Alkaline Phosphatase: 64 U/L (ref 39–117)
BUN: 14 mg/dL (ref 6–23)
CO2: 32 meq/L (ref 19–32)
Calcium: 9.5 mg/dL (ref 8.4–10.5)
Chloride: 103 meq/L (ref 96–112)
Creatinine, Ser: 0.83 mg/dL (ref 0.40–1.50)
GFR: 93.77 mL/min (ref >60.00)
Glucose, Bld: 106 mg/dL — ABNORMAL HIGH (ref 70–99)
Potassium: 4.6 meq/L (ref 3.5–5.1)
Sodium: 141 meq/L (ref 135–145)
Total Bilirubin: 0.7 mg/dL (ref 0.2–1.2)
Total Protein: 6.7 g/dL (ref 6.0–8.3)

## 2024-01-12 LAB — CBC
HCT: 45.3 % (ref 39.0–52.0)
Hemoglobin: 15.2 g/dL (ref 13.0–17.0)
MCHC: 33.6 g/dL (ref 30.0–36.0)
MCV: 91.7 fl (ref 78.0–100.0)
Platelets: 211 K/uL (ref 150.0–400.0)
RBC: 4.94 Mil/uL (ref 4.22–5.81)
RDW: 12.9 % (ref 11.5–15.5)
WBC: 5.8 K/uL (ref 4.0–10.5)

## 2024-01-12 LAB — HEMOGLOBIN A1C: Hgb A1c MFr Bld: 5.8 % (ref 4.6–6.5)

## 2024-01-12 LAB — LIPID PANEL
Cholesterol: 169 mg/dL (ref 0–200)
HDL: 59.4 mg/dL (ref >39.00)
LDL Cholesterol: 93 mg/dL (ref 0–99)
NonHDL: 109.37
Total CHOL/HDL Ratio: 3
Triglycerides: 83 mg/dL (ref 0.0–149.0)
VLDL: 16.6 mg/dL (ref 0.0–40.0)

## 2024-01-12 LAB — PSA: PSA: 0.82 ng/mL (ref 0.10–4.00)

## 2024-01-12 NOTE — Patient Instructions (Signed)
 Thank you for coming in today. No change in medications at this time. If there are any concerns on your bloodwork, I will let you know.  Pneumonia vaccine given today as well as the flu vaccine.  Take care!  Preventive Care 89-62 Years Old, Male Preventive care refers to lifestyle choices and visits with your health care provider that can promote health and wellness. Preventive care visits are also called wellness exams. What can I expect for my preventive care visit? Counseling During your preventive care visit, your health care provider may ask about your: Medical history, including: Past medical problems. Family medical history. Current health, including: Emotional well-being. Home life and relationship well-being. Sexual activity. Lifestyle, including: Alcohol, nicotine or tobacco, and drug use. Access to firearms. Diet, exercise, and sleep habits. Safety issues such as seatbelt and bike helmet use. Sunscreen use. Work and work Astronomer. Physical exam Your health care provider will check your: Height and weight. These may be used to calculate your BMI (body mass index). BMI is a measurement that tells if you are at a healthy weight. Waist circumference. This measures the distance around your waistline. This measurement also tells if you are at a healthy weight and may help predict your risk of certain diseases, such as type 2 diabetes and high blood pressure. Heart rate and blood pressure. Body temperature. Skin for abnormal spots. What immunizations do I need?  Vaccines are usually given at various ages, according to a schedule. Your health care provider will recommend vaccines for you based on your age, medical history, and lifestyle or other factors, such as travel or where you work. What tests do I need? Screening Your health care provider may recommend screening tests for certain conditions. This may include: Lipid and cholesterol levels. Diabetes screening. This is  done by checking your blood sugar (glucose) after you have not eaten for a while (fasting). Hepatitis B test. Hepatitis C test. HIV (human immunodeficiency virus) test. STI (sexually transmitted infection) testing, if you are at risk. Lung cancer screening. Prostate cancer screening. Colorectal cancer screening. Talk with your health care provider about your test results, treatment options, and if necessary, the need for more tests. Follow these instructions at home: Eating and drinking  Eat a diet that includes fresh fruits and vegetables, whole grains, lean protein, and low-fat dairy products. Take vitamin and mineral supplements as recommended by your health care provider. Do not drink alcohol if your health care provider tells you not to drink. If you drink alcohol: Limit how much you have to 0-2 drinks a day. Know how much alcohol is in your drink. In the [place] one drink equals one 12 oz bottle of beer (355 mL), one 5 oz glass of wine (148 mL), or one 1 oz glass of hard liquor (44 mL). Lifestyle Brush your teeth every morning and night with fluoride toothpaste. Floss one time each day. Exercise for at least 30 minutes 5 or more days each week. Do not use any products that contain nicotine or tobacco. These products include cigarettes, chewing tobacco, and vaping devices, such as e-cigarettes. If you need help quitting, ask your health care provider. Do not use drugs. If you are sexually active, practice safe sex. Use a condom or other form of protection to prevent STIs. Take aspirin only as told by your health care provider. Make sure that you understand how much to take and what form to take. Work with your health care provider to find out whether it is safe  and beneficial for you to take aspirin daily. Find healthy ways to manage stress, such as: Meditation, yoga, or listening to music. Journaling. Talking to a trusted person. Spending time with friends and family. Minimize  exposure to UV radiation to reduce your risk of skin cancer. Safety Always wear your seat belt while driving or riding in a vehicle. Do not drive: If you have been drinking alcohol. Do not ride with someone who has been drinking. When you are tired or distracted. While texting. If you have been using any mind-altering substances or drugs. Wear a helmet and other protective equipment during sports activities. If you have firearms in your house, make sure you follow all gun safety procedures. What's next? Go to your health care provider once a year for an annual wellness visit. Ask your health care provider how often you should have your eyes and teeth checked. Stay up to date on all vaccines. This information is not intended to replace advice given to you by your health care provider. Make sure you discuss any questions you have with your health care provider. Document Revised: 09/09/2020 Document Reviewed: 09/09/2020 Elsevier Patient Education  2024 [employer].

## 2024-01-12 NOTE — Progress Notes (Signed)
 Subjective:  Patient ID: JAIDON Willis, male    DOB: 01/12/1961  Age: 63 y.o. MRN: [id number]  CC:  Chief Complaint  Patient presents with   Annual Exam    No questions or concerns. Doing well.     HPI Patrick Willis presents for Annual Exam.  No acute concerns. No health changes.   PCP:[facility], Dr. Timmy, Lauraine Pepper, NP.Iron deficiency anemia, office visit February 4.  Mild microcytic anemia secondary to frequent blood donation.  Recommended decreasing donation to 3-4 times per year and continue oral iron supplement daily.  Follow-up as needed.  Still donating blood every 2 months, iron supplement every other day.  Lab Results  Component Value Date   WBC 7.4 05/02/2023   HGB 15.5 05/02/2023   HCT 46.1 05/02/2023   MCV 91.7 05/02/2023   PLT 217 05/02/2023  Sleep specialist, Dr. Chalice, OSA, treated with dental device. Working well. Dentist Dr. Micky following now.  Dermatology, Dr. Tricia - once per year.   Hx of Prediabetes: Diet/exercise approach.  History of prediabetes but A1c was in the normal range last year.  Weight is up a few pounds since that time. Lab Results  Component Value Date   HGBA1C 5.5 01/11/2023   Wt Readings from Last 3 Encounters:  01/12/24 205 lb (93 kg)  05/02/23 206 lb (93.4 kg)  01/11/23 200 lb 6.4 oz (90.9 kg)          01/12/2024    8:10 AM 01/11/2023    8:11 AM 01/05/2022    8:17 AM 07/01/2021    3:13 PM 12/28/2020    2:13 PM  Depression screen PHQ 2/9  Decreased Interest 0 0 0 0 0  Down, Depressed, Hopeless 0 0 0 0 0  PHQ - 2 Score 0 0 0 0 0  Altered sleeping 0 0  0   Tired, decreased energy 0 1  1   Change in appetite 0 0  0   Feeling bad or failure about yourself  0 0  0   Trouble concentrating 0 0  0   Moving slowly or fidgety/restless 0 0  0   Suicidal thoughts 0 0  0   PHQ-9 Score 0 1  1   Difficult doing work/chores Not difficult at all        Health Maintenance  Topic Date Due   Pneumococcal Vaccine:  50+ Years (1 of 1 - PCV) Never done   Influenza Vaccine  10/27/2023   COVID-19 Vaccine (4 - 2025-26 season) 01/28/2024 (Originally 11/27/2023)   DTaP/Tdap/Td (2 - Td or Tdap) 10/25/2027   Colonoscopy  03/11/2029   Hepatitis C Screening  Completed   HIV Screening  Completed   Zoster Vaccines- Shingrix  Completed   Hepatitis B Vaccines 19-59 Average Risk  Aged Out   HPV VACCINES  Aged Out   Meningococcal B Vaccine  Aged Out  Colonoscopy 03/11/2022 - repeat 36yrs.  Prostate: does not have family history of prostate cancer The natural history of prostate cancer and ongoing controversy regarding screening and potential treatment outcomes of prostate cancer has been discussed with the patient. The meaning of a false positive PSA and a false negative PSA has been discussed. He indicates understanding of the limitations of this screening test and wishes  to proceed with screening PSA testing. Lab Results  Component Value Date   PSA1 0.8 10/24/2019   PSA1 0.8 11/20/2018   PSA1 0.8 10/24/2017   PSA 0.92 01/11/2023  PSA 0.67 01/05/2022   PSA 1.03 12/23/2020    Immunization History  Administered Date(s) Administered   Influenza, Seasonal, Injecte, Preservative Fre 01/11/2023   Influenza,inj,Quad PF,6+ Mos 12/23/2016, 01/28/2018, 11/21/2018, 12/28/2020, 01/05/2022   Influenza-Unspecified 01/28/2018   Moderna Sars-Covid-2 Vaccination 06/13/2019, 07/11/2019, 03/10/2020   Tdap 10/24/2017   Zoster Recombinant(Shingrix) 10/24/2017, 01/28/2018  Flu vaccine given today. Option of COVID booster at pharmacy - deferred this year.  Prevnar 20 today.  No results found. [facility] - Dr. Kennyth.   Dental: [facility]. Every 6 months.   Alcohol: few per week.   Tobacco: none  Exercise: walking, tennis few days per week.   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/28/2021   History of ventral hernia repair 12/22/2020   Elevated systolic blood pressure reading without diagnosis of hypertension 11/25/2019   Sinus bradycardia by electrocardiogram 11/22/2019   First degree AV block 11/22/2019   Past Medical History:  Diagnosis Date   Anemia    GERD (gastroesophageal reflux disease)    Hypertension    Phreesia 10/28/2019   Sinus bradycardia on ECG    Rates in 40s and 50s at rest-with 1 AVB   Sleep apnea    MILD,WEAR MOUTH GUARD   Past Surgical History:  Procedure Laterality Date   HERNIA REPAIR     INCISIONAL HERNIA REPAIR N/A 03/12/2021   Procedure: LAPAROSCOPIC VENTRAL INCISIONAL HERNIA REPAIR;  Surgeon: Eletha Boas, MD;  Location: [facility];  Service: General;  Laterality: N/A;   INGUINAL HERNIA REPAIR     INSERTION OF MESH  03/12/2021   Procedure: INSERTION OF MESH;  Surgeon: Eletha Boas, MD;  Location: [facility];  Service: General;;   WISDOM TOOTH EXTRACTION     No Known Allergies Prior to Admission medications   Medication Sig Start Date End Date Taking? Authorizing Provider  Multiple Vitamins-Minerals (MULTIVITAMIN WITH MINERALS) tablet Take 1 tablet by mouth daily.   Yes [provider]  ferrous gluconate  (FERGON) 324 MG tablet TAKE 1 TABLET BY MOUTH EVERY DAY WITH BREAKFAST 10/25/22   Timmy Maude SAUNDERS, MD   Social History   Socioeconomic History   Marital status: Married    Spouse name: Not on file   Number of children: 3   Years of education: Not on file   Highest education level: Master's degree (e.g., MA, MS, MEng, MEd, MSW, MBA)  Occupational History   Occupation: SALES REP  Tobacco Use   Smoking status: Never    Passive exposure: Never   Smokeless tobacco: Never  Vaping Use   Vaping status: Never Used  Substance and Sexual Activity   Alcohol use: Yes    Alcohol/week: 4.0 standard drinks of alcohol    Types: 4 Cans of beer per week    Comment: OCCASSIONAL   Drug use: No   Sexual activity: Yes     Birth control/protection: Surgical  Other Topics Concern   Not on file  Social History Narrative   Exercise walking x 2 miles four times/wk      He and his family just returned from a trip to [place].  His twin daughters have been taking part in a high school summer abroad program in [place].  Their trip started in [place] and encompass much the [place] of [place] including [place] and [place]   Social Drivers of Health   Financial Resource Strain:  Low Risk  (01/08/2024)   Overall Financial Resource Strain (CARDIA)    Difficulty of Paying Living Expenses: Not hard at all  Food Insecurity: No Food Insecurity (01/08/2024)   Hunger Vital Sign    Worried About Running Out of Food in the Last Year: Never true    Ran Out of Food in the Last Year: Never true  Transportation Needs: No Transportation Needs (01/08/2024)   PRAPARE - Administrator, Civil Service (Medical): No    Lack of Transportation (Non-Medical): No  Physical Activity: Sufficiently Active (01/08/2024)   Exercise Vital Sign    Days of Exercise per Week: 7 days    Minutes of Exercise per Session: 60 min  Stress: No Stress Concern Present (01/08/2024)   [employer] of Occupational Health - Occupational Stress Questionnaire    Feeling of Stress: Not at all  Social Connections: Socially Integrated (01/08/2024)   Social Connection and Isolation Panel    Frequency of Communication with Friends and Family: More than three times a week    Frequency of Social Gatherings with Friends and Family: Once a week    Attends Religious Services: More than 4 times per year    Active Member of [employer] or Organizations: Yes    Attends Engineer, structural: More than 4 times per year    Marital Status: Married  Catering manager Violence: Not on file    Review of Systems  13 point review of systems per patient health survey noted.  Negative other than as indicated above or in HPI.   Objective:   Vitals:    01/12/24 0807  BP: 130/88  Pulse: (!) 50  Resp: 15  Temp: (!) 97.5 F (36.4 C)  TempSrc: Temporal  SpO2: 98%  Weight: 205 lb (93 kg)  Height: 5' 10 (1.778 m)     Physical Exam Vitals reviewed.  Constitutional:      Appearance: He is well-developed.  HENT:     Head: Normocephalic and atraumatic.     Right Ear: External ear normal.     Left Ear: External ear normal.  Eyes:     Conjunctiva/sclera: Conjunctivae normal.     Pupils: Pupils are equal, round, and reactive to light.  Neck:     Thyroid : No thyromegaly.  Cardiovascular:     Rate and Rhythm: Normal rate and regular rhythm.     Heart sounds: Normal heart sounds.  Pulmonary:     Effort: Pulmonary effort is normal. No respiratory distress.     Breath sounds: Normal breath sounds. No wheezing.  Abdominal:     General: There is no distension.     Palpations: Abdomen is soft.     Tenderness: There is no abdominal tenderness.  Musculoskeletal:        General: No tenderness. Normal range of motion.     Cervical back: Normal range of motion and neck supple.  Lymphadenopathy:     Cervical: No cervical adenopathy.  Skin:    General: Skin is warm and dry.  Neurological:     Mental Status: He is alert and oriented to person, place, and time.     Deep Tendon Reflexes: Reflexes are normal and symmetric.  Psychiatric:        Behavior: Behavior normal.        Assessment & Plan:  Patrick Willis is a 64 y.o. male . Annual physical exam  - -anticipatory guidance as below in AVS, screening labs above. Health maintenance items as above  in HPI discussed/recommended as applicable.   Need for influenza vaccination - Plan: Flu vaccine trivalent PF, 6mos and older(Flulaval,Afluria,Fluarix,Fluzone)  Prediabetes - Plan: Comprehensive metabolic panel with GFR, Hemoglobin A1c  - History of prediabetes, check labs and adjust plan accordingly.  Most recent A1c last year was in the normal range.  Few pound increase since last year.   Continue to watch diet, exercise as above.  Anemia, unspecified type - Plan: CBC  - History of anemia, but has regular testing with blood donation and reports stable readings.  Check updated CBC.  Continue iron every other day for now.  Elevated LDL cholesterol level - Plan: Comprehensive metabolic panel with GFR, Lipid panel  - Check labs and adjust plan accordingly, continue exercise, diet approach for now.  Screening for prostate cancer - Plan: PSA  Need for pneumococcal vaccination - Plan: Pneumococcal conjugate vaccine 20-valent (Prevnar 20)   No orders of the defined types were placed in this encounter.  Patient Instructions  Thank you for coming in today. No change in medications at this time. If there are any concerns on your bloodwork, I will let you know.  Pneumonia vaccine given today as well as the flu vaccine.  Take care!  Preventive Care 44-28 Years Old, Male Preventive care refers to lifestyle choices and visits with your health care provider that can promote health and wellness. Preventive care visits are also called wellness exams. What can I expect for my preventive care visit? Counseling During your preventive care visit, your health care provider may ask about your: Medical history, including: Past medical problems. Family medical history. Current health, including: Emotional well-being. Home life and relationship well-being. Sexual activity. Lifestyle, including: Alcohol, nicotine or tobacco, and drug use. Access to firearms. Diet, exercise, and sleep habits. Safety issues such as seatbelt and bike helmet use. Sunscreen use. Work and work Astronomer. Physical exam Your health care provider will check your: Height and weight. These may be used to calculate your BMI (body mass index). BMI is a measurement that tells if you are at a healthy weight. Waist circumference. This measures the distance around your waistline. This measurement also tells if you are  at a healthy weight and may help predict your risk of certain diseases, such as type 2 diabetes and high blood pressure. Heart rate and blood pressure. Body temperature. Skin for abnormal spots. What immunizations do I need?  Vaccines are usually given at various ages, according to a schedule. Your health care provider will recommend vaccines for you based on your age, medical history, and lifestyle or other factors, such as travel or where you work. What tests do I need? Screening Your health care provider may recommend screening tests for certain conditions. This may include: Lipid and cholesterol levels. Diabetes screening. This is done by checking your blood sugar (glucose) after you have not eaten for a while (fasting). Hepatitis B test. Hepatitis C test. HIV (human immunodeficiency virus) test. STI (sexually transmitted infection) testing, if you are at risk. Lung cancer screening. Prostate cancer screening. Colorectal cancer screening. Talk with your health care provider about your test results, treatment options, and if necessary, the need for more tests. Follow these instructions at home: Eating and drinking  Eat a diet that includes fresh fruits and vegetables, whole grains, lean protein, and low-fat dairy products. Take vitamin and mineral supplements as recommended by your health care provider. Do not drink alcohol if your health care provider tells you not to drink. If you drink alcohol:  Limit how much you have to 0-2 drinks a day. Know how much alcohol is in your drink. In the [place] one drink equals one 12 oz bottle of beer (355 mL), one 5 oz glass of wine (148 mL), or one 1 oz glass of hard liquor (44 mL). Lifestyle Brush your teeth every morning and night with fluoride toothpaste. Floss one time each day. Exercise for at least 30 minutes 5 or more days each week. Do not use any products that contain nicotine or tobacco. These products include cigarettes, chewing  tobacco, and vaping devices, such as e-cigarettes. If you need help quitting, ask your health care provider. Do not use drugs. If you are sexually active, practice safe sex. Use a condom or other form of protection to prevent STIs. Take aspirin only as told by your health care provider. Make sure that you understand how much to take and what form to take. Work with your health care provider to find out whether it is safe and beneficial for you to take aspirin daily. Find healthy ways to manage stress, such as: Meditation, yoga, or listening to music. Journaling. Talking to a trusted person. Spending time with friends and family. Minimize exposure to UV radiation to reduce your risk of skin cancer. Safety Always wear your seat belt while driving or riding in a vehicle. Do not drive: If you have been drinking alcohol. Do not ride with someone who has been drinking. When you are tired or distracted. While texting. If you have been using any mind-altering substances or drugs. Wear a helmet and other protective equipment during sports activities. If you have firearms in your house, make sure you follow all gun safety procedures. What's next? Go to your health care provider once a year for an annual wellness visit. Ask your health care provider how often you should have your eyes and teeth checked. Stay up to date on all vaccines. This information is not intended to replace advice given to you by your health care provider. Make sure you discuss any questions you have with your health care provider. Document Revised: 09/09/2020 Document Reviewed: 09/09/2020 Elsevier Patient Education  2024 Elsevier Inc.     Signed,   Reyes Pines, MD [facility], [facility] 01/12/24 8:31 AM

## 2024-01-18 ENCOUNTER — Ambulatory Visit: Payer: Self-pay | Admitting: Family Medicine

## 2024-03-01 ENCOUNTER — Encounter (HOSPITAL_COMMUNITY): Payer: Self-pay | Admitting: Surgery

## 2025-01-15 ENCOUNTER — Ambulatory Visit: Admitting: Family Medicine
